# Patient Record
Sex: Female | Born: 1955 | Race: White | Hispanic: No | Marital: Married | Smoking: Never smoker
Health system: Southern US, Community
[De-identification: ages and names within clinical notes are randomized; demographics above are authoritative.]

## PROBLEM LIST (undated history)

## (undated) DIAGNOSIS — E785 Hyperlipidemia, unspecified: Secondary | ICD-10-CM

## (undated) DIAGNOSIS — R079 Chest pain, unspecified: Secondary | ICD-10-CM

## (undated) DIAGNOSIS — H269 Unspecified cataract: Secondary | ICD-10-CM

## (undated) DIAGNOSIS — Z8739 Personal history of other diseases of the musculoskeletal system and connective tissue: Secondary | ICD-10-CM

## (undated) DIAGNOSIS — J301 Allergic rhinitis due to pollen: Secondary | ICD-10-CM

## (undated) DIAGNOSIS — M5441 Lumbago with sciatica, right side: Secondary | ICD-10-CM

## (undated) DIAGNOSIS — M1990 Unspecified osteoarthritis, unspecified site: Secondary | ICD-10-CM

## (undated) DIAGNOSIS — H9319 Tinnitus, unspecified ear: Secondary | ICD-10-CM

## (undated) DIAGNOSIS — M549 Dorsalgia, unspecified: Secondary | ICD-10-CM

## (undated) DIAGNOSIS — M722 Plantar fascial fibromatosis: Secondary | ICD-10-CM

## (undated) DIAGNOSIS — R0602 Shortness of breath: Secondary | ICD-10-CM

## (undated) DIAGNOSIS — I10 Essential (primary) hypertension: Secondary | ICD-10-CM

## (undated) DIAGNOSIS — R600 Localized edema: Secondary | ICD-10-CM

## (undated) DIAGNOSIS — M2550 Pain in unspecified joint: Secondary | ICD-10-CM

## (undated) DIAGNOSIS — IMO0002 Reserved for concepts with insufficient information to code with codable children: Secondary | ICD-10-CM

## (undated) DIAGNOSIS — I4510 Unspecified right bundle-branch block: Secondary | ICD-10-CM

## (undated) DIAGNOSIS — R12 Heartburn: Secondary | ICD-10-CM

## (undated) DIAGNOSIS — K5900 Constipation, unspecified: Secondary | ICD-10-CM

## (undated) DIAGNOSIS — T7840XA Allergy, unspecified, initial encounter: Secondary | ICD-10-CM

## (undated) HISTORY — DX: Hyperlipidemia, unspecified: E78.5

## (undated) HISTORY — DX: Chest pain, unspecified: R07.9

## (undated) HISTORY — DX: Shortness of breath: R06.02

## (undated) HISTORY — DX: Plantar fascial fibromatosis: M72.2

## (undated) HISTORY — DX: Reserved for concepts with insufficient information to code with codable children: IMO0002

## (undated) HISTORY — DX: Pain in unspecified joint: M25.50

## (undated) HISTORY — DX: Tinnitus, unspecified ear: H93.19

## (undated) HISTORY — DX: Lumbago with sciatica, right side: M54.41

## (undated) HISTORY — DX: Unspecified right bundle-branch block: I45.10

## (undated) HISTORY — DX: Allergic rhinitis due to pollen: J30.1

## (undated) HISTORY — DX: Unspecified osteoarthritis, unspecified site: M19.90

## (undated) HISTORY — DX: Dorsalgia, unspecified: M54.9

## (undated) HISTORY — DX: Allergy, unspecified, initial encounter: T78.40XA

## (undated) HISTORY — DX: Essential (primary) hypertension: I10

## (undated) HISTORY — DX: Localized edema: R60.0

## (undated) HISTORY — DX: Unspecified cataract: H26.9

## (undated) HISTORY — DX: Constipation, unspecified: K59.00

## (undated) HISTORY — DX: Heartburn: R12

## (undated) HISTORY — DX: Personal history of other diseases of the musculoskeletal system and connective tissue: Z87.39

---
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
## 1986-04-04 HISTORY — PX: CYSTECTOMY: SUR359

## 1998-04-27 ENCOUNTER — Other Ambulatory Visit: Admission: RE | Admit: 1998-04-27 | Discharge: 1998-04-27 | Payer: Self-pay | Admitting: Obstetrics and Gynecology

## 1999-10-22 ENCOUNTER — Ambulatory Visit (HOSPITAL_COMMUNITY): Admission: RE | Admit: 1999-10-22 | Discharge: 1999-10-22 | Payer: Self-pay | Admitting: Obstetrics and Gynecology

## 1999-10-22 ENCOUNTER — Encounter: Payer: Self-pay | Admitting: Obstetrics and Gynecology

## 2002-07-19 ENCOUNTER — Encounter: Payer: Self-pay | Admitting: Sports Medicine

## 2002-07-19 ENCOUNTER — Encounter: Admission: RE | Admit: 2002-07-19 | Discharge: 2002-07-19 | Payer: Self-pay | Admitting: Sports Medicine

## 2003-10-30 ENCOUNTER — Other Ambulatory Visit: Admission: RE | Admit: 2003-10-30 | Discharge: 2003-10-30 | Payer: Self-pay | Admitting: Obstetrics and Gynecology

## 2004-09-06 ENCOUNTER — Encounter: Admission: RE | Admit: 2004-09-06 | Discharge: 2004-09-06 | Payer: Self-pay | Admitting: Orthopedic Surgery

## 2005-10-17 ENCOUNTER — Ambulatory Visit (HOSPITAL_COMMUNITY): Admission: RE | Admit: 2005-10-17 | Discharge: 2005-10-17 | Payer: Self-pay | Admitting: Obstetrics and Gynecology

## 2010-04-30 ENCOUNTER — Other Ambulatory Visit (HOSPITAL_COMMUNITY): Payer: Self-pay | Admitting: Family Medicine

## 2010-04-30 DIAGNOSIS — Z0000 Encounter for general adult medical examination without abnormal findings: Secondary | ICD-10-CM

## 2010-05-06 ENCOUNTER — Ambulatory Visit (HOSPITAL_COMMUNITY)
Admission: RE | Admit: 2010-05-06 | Discharge: 2010-05-06 | Disposition: A | Discharge status: Home / Self Care | Payer: BC Managed Care – PPO | Source: Ambulatory Visit | Attending: Family Medicine | Admitting: Family Medicine

## 2010-05-06 DIAGNOSIS — Z1231 Encounter for screening mammogram for malignant neoplasm of breast: Secondary | ICD-10-CM | POA: Insufficient documentation

## 2010-05-06 DIAGNOSIS — Z0000 Encounter for general adult medical examination without abnormal findings: Secondary | ICD-10-CM

## 2010-10-04 LAB — HM COLONOSCOPY: HM Colonoscopy: NORMAL

## 2011-10-03 LAB — HM PAP SMEAR: HM Pap smear: NORMAL

## 2011-10-14 ENCOUNTER — Other Ambulatory Visit (HOSPITAL_COMMUNITY): Payer: Self-pay | Admitting: Nurse Practitioner

## 2011-10-14 DIAGNOSIS — Z1231 Encounter for screening mammogram for malignant neoplasm of breast: Secondary | ICD-10-CM

## 2011-11-02 ENCOUNTER — Ambulatory Visit (HOSPITAL_COMMUNITY)
Admission: RE | Admit: 2011-11-02 | Discharge: 2011-11-02 | Disposition: A | Discharge status: Home / Self Care | Payer: BC Managed Care – PPO | Source: Ambulatory Visit | Attending: Nurse Practitioner | Admitting: Nurse Practitioner

## 2011-11-02 DIAGNOSIS — Z1231 Encounter for screening mammogram for malignant neoplasm of breast: Secondary | ICD-10-CM | POA: Insufficient documentation

## 2012-10-01 ENCOUNTER — Other Ambulatory Visit (HOSPITAL_COMMUNITY): Payer: Self-pay | Admitting: Nurse Practitioner

## 2012-10-01 DIAGNOSIS — Z1231 Encounter for screening mammogram for malignant neoplasm of breast: Secondary | ICD-10-CM

## 2012-11-02 ENCOUNTER — Ambulatory Visit (HOSPITAL_COMMUNITY)
Admission: RE | Admit: 2012-11-02 | Discharge: 2012-11-02 | Disposition: A | Discharge status: Home / Self Care | Payer: BC Managed Care – PPO | Source: Ambulatory Visit | Attending: Nurse Practitioner | Admitting: Nurse Practitioner

## 2012-11-02 DIAGNOSIS — Z1231 Encounter for screening mammogram for malignant neoplasm of breast: Secondary | ICD-10-CM

## 2013-10-03 ENCOUNTER — Telehealth: Payer: Self-pay

## 2013-10-03 NOTE — Telephone Encounter (Signed)
New Patient Previous PCP:  [facility]   Medication and allergies:  Reviewed and updated  90 day supply/mail order: n/a Local pharmacy:  [facility] [phone] - [place], [place] - [address] [facility] [address]   Immunizations due:  Td/tdap   A/P: No changes to personal, family history or past surgical hx PAP- 10/2011- normal--[facility]- 2012- normal---Dr. Nolon Bussingider in [place] [facility]- 11/02/12- negative Tdap- unsure of last vaccine   To Discuss with Provider: Nothing at this time.

## 2013-10-07 ENCOUNTER — Encounter: Payer: Self-pay | Admitting: Family Medicine

## 2013-10-07 ENCOUNTER — Ambulatory Visit (INDEPENDENT_AMBULATORY_CARE_PROVIDER_SITE_OTHER): Payer: BC Managed Care – PPO | Admitting: Family Medicine

## 2013-10-07 VITALS — BP 146/96 | HR 74 | Temp 98.5°F | Ht 63.5 in | Wt 287.2 lb

## 2013-10-07 DIAGNOSIS — R030 Elevated blood-pressure reading, without diagnosis of hypertension: Secondary | ICD-10-CM

## 2013-10-07 DIAGNOSIS — Z0000 Encounter for general adult medical examination without abnormal findings: Secondary | ICD-10-CM

## 2013-10-07 DIAGNOSIS — IMO0001 Reserved for inherently not codable concepts without codable children: Secondary | ICD-10-CM | POA: Insufficient documentation

## 2013-10-07 HISTORY — DX: Encounter for general adult medical examination without abnormal findings: Z00.00

## 2013-10-07 HISTORY — DX: Morbid (severe) obesity due to excess calories: E66.01

## 2013-10-07 NOTE — Assessment & Plan Note (Signed)
New.  Pt's BMI>50.  Discussed need for healthy, low carb diet and regular exercise.  Will check labs to risk stratify.  Will follow closely.

## 2013-10-07 NOTE — Assessment & Plan Note (Signed)
Pt's PE WNL w/ exception of obesity.  UTD on mammo, pap, colonoscopy.  Check labs.  Anticipatory guidance provided.  

## 2013-10-07 NOTE — Patient Instructions (Signed)
Follow up in 3 months to recheck BP and assess weight loss progress We'll notify you of your lab results and make any changes if needed Try and make healthy, low carb food choices and goal is for 30 min of activity 3-4x/week (if not more) Call with any questions or concerns Welcome!  We're glad to have you!!

## 2013-10-07 NOTE — Progress Notes (Signed)
   Subjective:    Patient ID: Mary Montgomery, female    DOB: 10/12/1955, 58 y.o.   MRN: [id number]  HPI New to establish.  Previous MD- [facility]  UTD on mammo (August), Colonoscopy, pap (due next year)  Elevated BP- pt has cuff at home, admits to being nervous for today's appt.   Review of Systems Patient reports no vision/ hearing changes, adenopathy,fever, weight change,  persistant/recurrent hoarseness , swallowing issues, chest pain, palpitations, edema, persistant/recurrent cough, hemoptysis, dyspnea (rest/exertional/paroxysmal nocturnal), gastrointestinal bleeding (melena, rectal bleeding), abdominal pain, significant heartburn, bowel changes, GU symptoms (dysuria, hematuria, incontinence), Gyn symptoms (abnormal  bleeding, pain),  syncope, focal weakness, memory loss, numbness & tingling, skin/hair/nail changes, abnormal bruising or bleeding, anxiety, or depression.     Objective:   Physical Exam General Appearance:    Alert, cooperative, no distress, appears stated age, obese  Head:    Normocephalic, without obvious abnormality, atraumatic  Eyes:    PERRL, conjunctiva/corneas clear, EOM's intact, fundi    benign, both eyes  Ears:    Normal TM's and external ear canals, both ears  Nose:   Nares normal, septum midline, mucosa normal, no drainage    or sinus tenderness  Throat:   Lips, mucosa, and tongue normal; teeth and gums normal  Neck:   Supple, symmetrical, trachea midline, no adenopathy;    Thyroid: no enlargement/tenderness/nodules  Back:     Symmetric, no curvature, ROM normal, no CVA tenderness  Lungs:     Clear to auscultation bilaterally, respirations unlabored  Chest Wall:    No tenderness or deformity   Heart:    Regular rate and rhythm, S1 and S2 normal, no murmur, rub   or gallop  Breast Exam:    Deferred to mammo  Abdomen:     Soft, non-tender, bowel sounds active all four quadrants,    no masses, no organomegaly  Genitalia:    Deferred to next  year  Rectal:    Extremities:   Extremities normal, atraumatic, no cyanosis or edema  Pulses:   2+ and symmetric all extremities  Skin:   Skin color, texture, turgor normal, no rashes or lesions  Lymph nodes:   Cervical, supraclavicular, and axillary nodes normal  Neurologic:   CNII-XII intact, normal strength, sensation and reflexes    throughout          Assessment & Plan:

## 2013-10-07 NOTE — Assessment & Plan Note (Signed)
New to provider.  Pt reports she was nervous for appt today.  Has home cuff- willing to check and report if readings consistently >145/90.  Reviewed importance of low sodium diet, regular exercise, and weight loss.  Pt expressed understanding and is in agreement w/ plan.

## 2013-10-07 NOTE — Progress Notes (Signed)
Pre visit review using our clinic review tool, if applicable. No additional management support is needed unless otherwise documented below in the visit note. 

## 2013-10-08 LAB — CBC WITH DIFFERENTIAL/PLATELET
BASOS ABS: 0.1 10*3/uL (ref 0.0–0.1)
BASOS PCT: 0.7 % (ref 0.0–3.0)
EOS ABS: 0.1 10*3/uL (ref 0.0–0.7)
Eosinophils Relative: 1.1 % (ref 0.0–5.0)
HEMATOCRIT: 44.1 % (ref 36.0–46.0)
HEMOGLOBIN: 14.7 g/dL (ref 12.0–15.0)
LYMPHS ABS: 1.5 10*3/uL (ref 0.7–4.0)
LYMPHS PCT: 20.9 % (ref 12.0–46.0)
MCHC: 33.2 g/dL (ref 30.0–36.0)
MCV: 90.7 fl (ref 78.0–100.0)
Monocytes Absolute: 0.3 10*3/uL (ref 0.1–1.0)
Monocytes Relative: 4.3 % (ref 3.0–12.0)
NEUTROS ABS: 5.1 10*3/uL (ref 1.4–7.7)
Neutrophils Relative %: 73 % (ref 43.0–77.0)
Platelets: 222 10*3/uL (ref 150.0–400.0)
RBC: 4.87 Mil/uL (ref 3.87–5.11)
RDW: 14.3 % (ref 11.5–15.5)
WBC: 7 10*3/uL (ref 4.0–10.5)

## 2013-10-08 LAB — HEPATIC FUNCTION PANEL
ALK PHOS: 47 U/L (ref 39–117)
ALT: 23 U/L (ref 0–35)
AST: 20 U/L (ref 0–37)
Albumin: 3.9 g/dL (ref 3.5–5.2)
BILIRUBIN DIRECT: 0.1 mg/dL (ref 0.0–0.3)
BILIRUBIN TOTAL: 0.5 mg/dL (ref 0.2–1.2)
TOTAL PROTEIN: 6.8 g/dL (ref 6.0–8.3)

## 2013-10-08 LAB — BASIC METABOLIC PANEL
BUN: 17 mg/dL (ref 6–23)
CALCIUM: 9.1 mg/dL (ref 8.4–10.5)
CO2: 31 mEq/L (ref 19–32)
Chloride: 101 mEq/L (ref 96–112)
Creatinine, Ser: 0.6 mg/dL (ref 0.4–1.2)
GFR: 116.57 mL/min (ref >60.00)
GLUCOSE: 79 mg/dL (ref 70–99)
POTASSIUM: 4.1 meq/L (ref 3.5–5.1)
SODIUM: 139 meq/L (ref 135–145)

## 2013-10-08 LAB — LIPID PANEL
CHOL/HDL RATIO: 4
CHOLESTEROL: 265 mg/dL — AB (ref 0–200)
HDL: 72.4 mg/dL (ref >39.00)
LDL CALC: 162 mg/dL — AB (ref 0–99)
NONHDL: 192.6
Triglycerides: 151 mg/dL — ABNORMAL HIGH (ref 0.0–149.0)
VLDL: 30.2 mg/dL (ref 0.0–40.0)

## 2013-10-08 LAB — TSH: TSH: 0.42 u[IU]/mL (ref 0.35–4.50)

## 2013-10-08 LAB — VITAMIN D 25 HYDROXY (VIT D DEFICIENCY, FRACTURES): VITD: 34.33 ng/mL

## 2013-10-08 LAB — HEMOGLOBIN A1C: Hgb A1c MFr Bld: 5.6 % (ref 4.6–6.5)

## 2013-10-09 ENCOUNTER — Other Ambulatory Visit: Payer: Self-pay | Admitting: General Practice

## 2013-10-09 DIAGNOSIS — E785 Hyperlipidemia, unspecified: Secondary | ICD-10-CM

## 2013-10-09 MED ORDER — ATORVASTATIN CALCIUM 20 MG PO TABS: 20.0000 mg | ORAL_TABLET | Freq: Every day | ORAL | Status: DC

## 2013-10-28 ENCOUNTER — Other Ambulatory Visit: Payer: Self-pay | Admitting: Family Medicine

## 2013-10-28 DIAGNOSIS — Z1231 Encounter for screening mammogram for malignant neoplasm of breast: Secondary | ICD-10-CM

## 2013-11-28 ENCOUNTER — Ambulatory Visit (HOSPITAL_COMMUNITY): Payer: BC Managed Care – PPO

## 2013-12-05 ENCOUNTER — Ambulatory Visit (HOSPITAL_COMMUNITY): Payer: BC Managed Care – PPO

## 2013-12-17 ENCOUNTER — Ambulatory Visit (HOSPITAL_COMMUNITY)
Admission: RE | Admit: 2013-12-17 | Discharge: 2013-12-17 | Disposition: A | Discharge status: Home / Self Care | Payer: BC Managed Care – PPO | Source: Ambulatory Visit | Attending: Family Medicine | Admitting: Family Medicine

## 2013-12-17 DIAGNOSIS — Z1231 Encounter for screening mammogram for malignant neoplasm of breast: Secondary | ICD-10-CM | POA: Diagnosis not present

## 2014-01-08 ENCOUNTER — Ambulatory Visit: Payer: BC Managed Care – PPO | Admitting: Family Medicine

## 2014-01-09 ENCOUNTER — Ambulatory Visit (INDEPENDENT_AMBULATORY_CARE_PROVIDER_SITE_OTHER): Payer: BC Managed Care – PPO | Admitting: Family Medicine

## 2014-01-09 ENCOUNTER — Encounter: Payer: Self-pay | Admitting: Family Medicine

## 2014-01-09 DIAGNOSIS — Z23 Encounter for immunization: Secondary | ICD-10-CM

## 2014-01-09 DIAGNOSIS — E785 Hyperlipidemia, unspecified: Secondary | ICD-10-CM

## 2014-01-09 HISTORY — DX: Hyperlipidemia, unspecified: E78.5

## 2014-01-09 NOTE — Assessment & Plan Note (Signed)
Noted on labs done at last visit.  Pt did not start Lipitor as recommended.  Started herbal OTC product.  Pt reports switching to low fat diet.  Repeat labs and start meds prn.  Pt expressed understanding and is in agreement w/ plan.

## 2014-01-09 NOTE — Patient Instructions (Signed)
Schedule a fasting lab appt at your convenience We'll notify you of your lab results and make any changes if needed Keep up the good work on the healthy diet- it's working! Continue to get regular exercise as you are able Call with any questions or concerns ENJOY THE [place]!!!!

## 2014-01-09 NOTE — Progress Notes (Signed)
   Subjective:    Patient ID: Mary Montgomery, female    DOB: Jul 11, 1955, 58 y.o.   MRN: [id number]  HPI Obesity- ongoing issue for pt.  Pt has lost 4 lbs.  Pt reports she has changed back to low fat diet.  Pt reports this 'feels better' than low carb.  Pt reports increased exercise compared to previous after having knee procedures this summer.  Hyperlipidemia- noted at last visit, LDL 162 and total cholesterol 265.  Pt did not start Lipitor as recommended.  Trying OTC herbal supplement- Pau d' arco.  Pt not fasting today.  No CP, SOB, HAs, visual changes, abd pain, edema.   Review of Systems For ROS see HPI     Objective:   Physical Exam  Vitals reviewed. Constitutional: She is oriented to person, place, and time. She appears well-developed and well-nourished. No distress.  obese  HENT:  Head: Normocephalic and atraumatic.  Eyes: Conjunctivae and EOM are normal. Pupils are equal, round, and reactive to light.  Neck: Normal range of motion. Neck supple. No thyromegaly present.  Cardiovascular: Normal rate, regular rhythm, normal heart sounds and intact distal pulses.   No murmur heard. Pulmonary/Chest: Effort normal and breath sounds normal. No respiratory distress.  Abdominal: Soft. She exhibits no distension. There is no tenderness.  Musculoskeletal: She exhibits no edema.  Lymphadenopathy:    She has no cervical adenopathy.  Neurological: She is alert and oriented to person, place, and time.  Skin: Skin is warm and dry.  Psychiatric: She has a normal mood and affect. Her behavior is normal.          Assessment & Plan:

## 2014-01-09 NOTE — Progress Notes (Signed)
Pre visit review using our clinic review tool, if applicable. No additional management support is needed unless otherwise documented below in the visit note. 

## 2014-01-09 NOTE — Assessment & Plan Note (Signed)
Pt has lost 4 lbs since last visit.  Applauded her efforts.  Stressed ongoing need for healthy diet and addition of regular exercise.  Will follow.

## 2014-01-15 ENCOUNTER — Other Ambulatory Visit (INDEPENDENT_AMBULATORY_CARE_PROVIDER_SITE_OTHER): Payer: BC Managed Care – PPO

## 2014-01-15 DIAGNOSIS — E785 Hyperlipidemia, unspecified: Secondary | ICD-10-CM

## 2014-01-15 LAB — LIPID PANEL
CHOL/HDL RATIO: 4
CHOLESTEROL: 240 mg/dL — AB (ref 0–200)
HDL: 60.8 mg/dL (ref >39.00)
LDL CALC: 161 mg/dL — AB (ref 0–99)
NonHDL: 179.2
TRIGLYCERIDES: 93 mg/dL (ref 0.0–149.0)
VLDL: 18.6 mg/dL (ref 0.0–40.0)

## 2014-01-15 LAB — HEPATIC FUNCTION PANEL
ALT: 24 U/L (ref 0–35)
AST: 19 U/L (ref 0–37)
Albumin: 3.5 g/dL (ref 3.5–5.2)
Alkaline Phosphatase: 54 U/L (ref 39–117)
BILIRUBIN TOTAL: 0.6 mg/dL (ref 0.2–1.2)
Bilirubin, Direct: 0 mg/dL (ref 0.0–0.3)
TOTAL PROTEIN: 7.2 g/dL (ref 6.0–8.3)

## 2014-01-21 ENCOUNTER — Other Ambulatory Visit: Payer: BC Managed Care – PPO

## 2015-03-25 ENCOUNTER — Telehealth: Payer: Self-pay

## 2015-03-25 NOTE — Telephone Encounter (Signed)
Pre Visit call completed. 

## 2015-03-26 ENCOUNTER — Encounter: Payer: Self-pay | Admitting: Family Medicine

## 2015-03-26 ENCOUNTER — Encounter: Payer: BC Managed Care – PPO | Admitting: Family Medicine

## 2015-03-26 ENCOUNTER — Ambulatory Visit (INDEPENDENT_AMBULATORY_CARE_PROVIDER_SITE_OTHER): Payer: BC Managed Care – PPO | Admitting: Family Medicine

## 2015-03-26 ENCOUNTER — Other Ambulatory Visit (HOSPITAL_COMMUNITY)
Admission: RE | Admit: 2015-03-26 | Discharge: 2015-03-26 | Disposition: A | Discharge status: Home / Self Care | Payer: BC Managed Care – PPO | Source: Ambulatory Visit | Attending: Family Medicine | Admitting: Family Medicine

## 2015-03-26 VITALS — BP 138/90 | HR 60 | Temp 97.4°F | Ht 63.0 in | Wt 281.1 lb

## 2015-03-26 DIAGNOSIS — Z124 Encounter for screening for malignant neoplasm of cervix: Secondary | ICD-10-CM

## 2015-03-26 DIAGNOSIS — Z0000 Encounter for general adult medical examination without abnormal findings: Secondary | ICD-10-CM | POA: Diagnosis not present

## 2015-03-26 DIAGNOSIS — Z01419 Encounter for gynecological examination (general) (routine) without abnormal findings: Secondary | ICD-10-CM | POA: Diagnosis present

## 2015-03-26 DIAGNOSIS — Z1151 Encounter for screening for human papillomavirus (HPV): Secondary | ICD-10-CM | POA: Insufficient documentation

## 2015-03-26 DIAGNOSIS — Z1231 Encounter for screening mammogram for malignant neoplasm of breast: Secondary | ICD-10-CM

## 2015-03-26 LAB — CBC WITH DIFFERENTIAL/PLATELET
BASOS PCT: 0.4 % (ref 0.0–3.0)
Basophils Absolute: 0 10*3/uL (ref 0.0–0.1)
EOS ABS: 0.1 10*3/uL (ref 0.0–0.7)
Eosinophils Relative: 1.2 % (ref 0.0–5.0)
HEMATOCRIT: 43 % (ref 36.0–46.0)
Hemoglobin: 14.1 g/dL (ref 12.0–15.0)
LYMPHS ABS: 2 10*3/uL (ref 0.7–4.0)
LYMPHS PCT: 29.7 % (ref 12.0–46.0)
MCHC: 32.7 g/dL (ref 30.0–36.0)
MCV: 90.4 fl (ref 78.0–100.0)
Monocytes Absolute: 0.4 10*3/uL (ref 0.1–1.0)
Monocytes Relative: 5.8 % (ref 3.0–12.0)
NEUTROS ABS: 4.3 10*3/uL (ref 1.4–7.7)
Neutrophils Relative %: 62.9 % (ref 43.0–77.0)
PLATELETS: 192 10*3/uL (ref 150.0–400.0)
RBC: 4.75 Mil/uL (ref 3.87–5.11)
RDW: 13.5 % (ref 11.5–15.5)
WBC: 6.8 10*3/uL (ref 4.0–10.5)

## 2015-03-26 LAB — LIPID PANEL
CHOLESTEROL: 208 mg/dL — AB (ref 0–200)
HDL: 66.1 mg/dL (ref >39.00)
LDL CALC: 126 mg/dL — AB (ref 0–99)
NonHDL: 141.81
TRIGLYCERIDES: 81 mg/dL (ref 0.0–149.0)
Total CHOL/HDL Ratio: 3
VLDL: 16.2 mg/dL (ref 0.0–40.0)

## 2015-03-26 LAB — HEPATIC FUNCTION PANEL
ALK PHOS: 57 U/L (ref 39–117)
ALT: 25 U/L (ref 0–35)
AST: 20 U/L (ref 0–37)
Albumin: 4.1 g/dL (ref 3.5–5.2)
Bilirubin, Direct: 0.1 mg/dL (ref 0.0–0.3)
Total Bilirubin: 0.5 mg/dL (ref 0.2–1.2)
Total Protein: 6.9 g/dL (ref 6.0–8.3)

## 2015-03-26 LAB — BASIC METABOLIC PANEL
BUN: 14 mg/dL (ref 6–23)
CHLORIDE: 102 meq/L (ref 96–112)
CO2: 33 mEq/L — ABNORMAL HIGH (ref 19–32)
Calcium: 9.2 mg/dL (ref 8.4–10.5)
Creatinine, Ser: 0.51 mg/dL (ref 0.40–1.20)
GFR: 131.84 mL/min (ref >60.00)
GLUCOSE: 79 mg/dL (ref 70–99)
POTASSIUM: 3.7 meq/L (ref 3.5–5.1)
Sodium: 140 mEq/L (ref 135–145)

## 2015-03-26 LAB — HEMOGLOBIN A1C: Hgb A1c MFr Bld: 5.7 % (ref 4.6–6.5)

## 2015-03-26 LAB — TSH: TSH: 1.46 u[IU]/mL (ref 0.35–4.50)

## 2015-03-26 LAB — VITAMIN D 25 HYDROXY (VIT D DEFICIENCY, FRACTURES): VITD: 28.7 ng/mL — AB (ref 30.00–100.00)

## 2015-03-26 NOTE — Assessment & Plan Note (Signed)
Ongoing issue.  Pt has lost a few lbs since last visit w/ changes to diet.  Encouraged regular exercise.  Check labs to risk stratify.  Will continue to follow.

## 2015-03-26 NOTE — Progress Notes (Signed)
   Subjective:    Patient ID: Mary BostonSharon L Geise, female    DOB: [birth year], 57 y.o.   MRN: [id number]  HPI CPE- UTD on mammo, due for mammo.  Due for pap.   Review of Systems Patient reports no vision/ hearing changes, adenopathy,fever, weight change,  persistant/recurrent hoarseness , swallowing issues, chest pain, palpitations, edema, persistant/recurrent cough, hemoptysis, dyspnea (rest/exertional/paroxysmal nocturnal), gastrointestinal bleeding (melena, rectal bleeding), abdominal pain, significant heartburn, bowel changes, GU symptoms (dysuria, hematuria, incontinence), Gyn symptoms (abnormal  bleeding, pain),  syncope, focal weakness, memory loss, numbness & tingling, skin/hair/nail changes, abnormal bruising or bleeding, anxiety, or depression.     Objective:   Physical Exam  General Appearance:    Alert, cooperative, no distress, appears stated age, morbidly obese  Head:    Normocephalic, without obvious abnormality, atraumatic  Eyes:    PERRL, conjunctiva/corneas clear, EOM's intact, fundi    benign, both eyes  Ears:    Normal TM's and external ear canals, both ears  Nose:   Nares normal, septum midline, mucosa normal, no drainage    or sinus tenderness  Throat:   Lips, mucosa, and tongue normal; teeth and gums normal  Neck:   Supple, symmetrical, trachea midline, no adenopathy;    Thyroid: no enlargement/tenderness/nodules  Back:     Symmetric, no curvature, ROM normal, no CVA tenderness  Lungs:     Clear to auscultation bilaterally, respirations unlabored  Chest Wall:    No tenderness or deformity   Heart:    Regular rate and rhythm, S1 and S2 normal, no murmur, rub   or gallop  Breast Exam:    No tenderness, masses, or nipple abnormality  Abdomen:     Soft, non-tender, bowel sounds active all four quadrants,    no masses, no organomegaly  Genitalia:    External genitalia normal, cervix normal in appearance, no CMT, uterus in normal size and position, adnexa w/out mass or  tenderness, mucosa pink and moist, no lesions or discharge present  Rectal:    Normal external appearance  Extremities:   Extremities normal, atraumatic, no cyanosis or edema  Pulses:   2+ and symmetric all extremities  Skin:   Skin color, texture, turgor normal, no rashes or lesions  Lymph nodes:   Cervical, supraclavicular, and axillary nodes normal  Neurologic:   CNII-XII intact, normal strength, sensation and reflexes    throughout          Assessment & Plan:

## 2015-03-26 NOTE — Assessment & Plan Note (Signed)
Pt's PE WNL w/ exception of morbid obesity.  Due for mammo- order entered.  Pap collected today.  UTD on colonoscopy.  Check labs.  Anticipatory guidance provided.

## 2015-03-26 NOTE — Progress Notes (Signed)
Pre visit review using our clinic review tool, if applicable. No additional management support is needed unless otherwise documented below in the visit note. 

## 2015-03-26 NOTE — Patient Instructions (Signed)
Schedule a procedure visit for your skin tags (30 minutes at your convenience) We'll notify you of your lab results and make any changes if needed Stop downstairs and schedule your mammogram Continue to work on healthy diet and regular exercise- you are doing it! Call with any questions or concerns If you want to join us at the new [place] office, any scheduled appointments will automatically transfer and we will see you at [address], [place], [place] [id number] (OPENING 04/07/15) Happy Holidays!!!

## 2015-03-27 LAB — CYTOLOGY - PAP

## 2015-03-31 ENCOUNTER — Ambulatory Visit (HOSPITAL_BASED_OUTPATIENT_CLINIC_OR_DEPARTMENT_OTHER)
Admission: RE | Admit: 2015-03-31 | Discharge: 2015-03-31 | Disposition: A | Discharge status: Home / Self Care | Payer: BC Managed Care – PPO | Source: Ambulatory Visit | Attending: Family Medicine | Admitting: Family Medicine

## 2015-03-31 DIAGNOSIS — Z1231 Encounter for screening mammogram for malignant neoplasm of breast: Secondary | ICD-10-CM | POA: Insufficient documentation

## 2015-04-17 ENCOUNTER — Ambulatory Visit (INDEPENDENT_AMBULATORY_CARE_PROVIDER_SITE_OTHER): Payer: BC Managed Care – PPO | Admitting: Family Medicine

## 2015-04-17 ENCOUNTER — Encounter: Payer: Self-pay | Admitting: Family Medicine

## 2015-04-17 VITALS — BP 128/86 | HR 79 | Temp 98.1°F | Ht 63.0 in | Wt 285.4 lb

## 2015-04-17 DIAGNOSIS — Q828 Other specified congenital malformations of skin: Secondary | ICD-10-CM | POA: Diagnosis not present

## 2015-04-17 NOTE — Patient Instructions (Signed)
Follow up as needed Keep areas clean and dry The silver nitrate will look charred- that's normal If you develop pus or spreading redness-unlikely but possible- let me know! Happy New Year!!!

## 2015-04-17 NOTE — Progress Notes (Signed)
Pre visit review using our clinic review tool, if applicable. No additional management support is needed unless otherwise documented below in the visit note. 

## 2015-04-19 NOTE — Progress Notes (Signed)
   Subjective:    Patient ID: Duane BostonSharon L Spielmann, female    DOB: June 19, 1955, 60 y.o.   MRN: [id number]  HPI Pt here today for skin tag removal.  Pt gave consent for 4 skin tags under L arm and 1 under R arm to be removed.     Review of Systems For ROS see HPI     Objective:   Physical Exam Area cleaned w/ alcohol and betadine.  1% lidocaine injected into the base of each skin tag.  Using gentle traction, each skin tag was snipped using sharp sterile scissors.  Pressure was applied w/ gauze and then each area was cauterized using silver nitrate sticks.  Minimal bleeding noted.  Abx ointment was applied to each and topped w/ bandaids.  Pt tolerated procedure w/o difficulty and was pleased w/ results.       Assessment & Plan:  Skin tags- 5 removed using above technique.  No noted complications.  Reviewed wound care w/ pt.  Pt expressed understanding and is in agreement w/ plan.

## 2016-06-14 ENCOUNTER — Other Ambulatory Visit: Payer: Self-pay | Admitting: Family Medicine

## 2016-06-14 DIAGNOSIS — Z1231 Encounter for screening mammogram for malignant neoplasm of breast: Secondary | ICD-10-CM

## 2016-07-04 ENCOUNTER — Ambulatory Visit (HOSPITAL_BASED_OUTPATIENT_CLINIC_OR_DEPARTMENT_OTHER)
Admission: RE | Admit: 2016-07-04 | Discharge: 2016-07-04 | Disposition: A | Discharge status: Home / Self Care | Payer: BC Managed Care – PPO | Source: Ambulatory Visit | Attending: Family Medicine | Admitting: Family Medicine

## 2016-07-04 DIAGNOSIS — N6489 Other specified disorders of breast: Secondary | ICD-10-CM | POA: Insufficient documentation

## 2016-07-04 DIAGNOSIS — Z1231 Encounter for screening mammogram for malignant neoplasm of breast: Secondary | ICD-10-CM

## 2016-09-28 ENCOUNTER — Ambulatory Visit (INDEPENDENT_AMBULATORY_CARE_PROVIDER_SITE_OTHER): Payer: BC Managed Care – PPO | Admitting: Physician Assistant

## 2016-09-28 ENCOUNTER — Encounter: Payer: Self-pay | Admitting: Physician Assistant

## 2016-09-28 VITALS — BP 130/82 | HR 69 | Temp 99.2°F | Resp 14 | Ht 63.0 in | Wt 288.0 lb

## 2016-09-28 DIAGNOSIS — H6121 Impacted cerumen, right ear: Secondary | ICD-10-CM

## 2016-09-28 NOTE — Patient Instructions (Addendum)
Please avoid putting qtips in the ear. The impaction has been removed. Follow-up if there is any recurrence of symptoms.   Earwax Buildup, Adult The ears produce a substance called earwax that helps keep bacteria out of the ear and protects the skin in the ear canal. Occasionally, earwax can build up in the ear and cause discomfort or hearing loss. What increases the risk? This condition is more likely to develop in people who:  Are female.  Are elderly.  Naturally produce more earwax.  Clean their ears often with cotton swabs.  Use earplugs often.  Use in-ear headphones often.  Wear hearing aids.  Have narrow ear canals.  Have earwax that is overly thick or sticky.  Have eczema.  Are dehydrated.  Have excess hair in the ear canal.  What are the signs or symptoms? Symptoms of this condition include:  Reduced or muffled hearing.  A feeling of fullness in the ear or feeling that the ear is plugged.  Fluid coming from the ear.  Ear pain.  Ear itch.  Ringing in the ear.  Coughing.  An obvious piece of earwax that can be seen inside the ear canal.  How is this diagnosed? This condition may be diagnosed based on:  Your symptoms.  Your medical history.  An ear exam. During the exam, your health care provider will look into your ear with an instrument called an otoscope.  You may have tests, including a hearing test. How is this treated? This condition may be treated by:  Using ear drops to soften the earwax.  Having the earwax removed by a health care provider. The health care provider may: ? Flush the ear with water. ? Use an instrument that has a loop on the end (curette). ? Use a suction device.  Surgery to remove the wax buildup. This may be done in severe cases.  Follow these instructions at home:  Take over-the-counter and prescription medicines only as told by your health care provider.  Do not put any objects, including cotton swabs, into  your ear. You can clean the opening of your ear canal with a washcloth or facial tissue.  Follow instructions from your health care provider about cleaning your ears. Do not over-clean your ears.  Drink enough fluid to keep your urine clear or pale yellow. This will help to thin the earwax.  Keep all follow-up visits as told by your health care provider. If earwax builds up in your ears often or if you use hearing aids, consider seeing your health care provider for routine, preventive ear cleanings. Ask your health care provider how often you should schedule your cleanings.  If you have hearing aids, clean them according to instructions from the manufacturer and your health care provider. Contact a health care provider if:  You have ear pain.  You develop a fever.  You have blood, pus, or other fluid coming from your ear.  You have hearing loss.  You have ringing in your ears that does not go away.  Your symptoms do not improve with treatment.  You feel like the room is spinning (vertigo). Summary  Earwax can build up in the ear and cause discomfort or hearing loss.  The most common symptoms of this condition include reduced or muffled hearing and a feeling of fullness in the ear or feeling that the ear is plugged.  This condition may be diagnosed based on your symptoms, your medical history, and an ear exam.  This condition may be  treated by using ear drops to soften the earwax or by having the earwax removed by a health care provider.  Do not put any objects, including cotton swabs, into your ear. You can clean the opening of your ear canal with a washcloth or facial tissue. This information is not intended to replace advice given to you by your health care provider. Make sure you discuss any questions you have with your health care provider. Document Released: 04/28/2004 Document Revised: 06/01/2016 Document Reviewed: 06/01/2016 Elsevier Interactive Patient Education  [employer].

## 2016-09-28 NOTE — Progress Notes (Signed)
   Patient presents to clinic today c/o 3 days of clogged sensation in the R ear. Patient endorses symptoms started after taking a shower. Notes ear fullness. Denies ear pain or drainage from the ear. Denies cold or URI symptoms. Has history of swimmer's ear and ear wax impactions  Past Medical History:  Diagnosis Date  . Arthritis    right knee  . Bulging disc    Lumber 3/4  . Degenerated intervertebral disc    neck  . Hay fever   . Hyperlipemia   . Hypertension   . Plantar fasciitis    bilaterl feet    Current Outpatient Prescriptions on File Prior to Visit  Medication Sig Dispense Refill  . cholecalciferol (VITAMIN D) 1000 UNITS tablet Take 1,000 Units by mouth daily.    . Multiple Vitamin (MULTIVITAMIN) tablet Take 1 tablet by mouth daily.    Marland Kitchen. PAU D ARCO PO Take 4 tablets by mouth daily.    . Red Yeast Rice Extract (RED YEAST RICE PO) Take by mouth.     No current facility-administered medications on file prior to visit.     No Known Allergies  Family History  Problem Relation Age of Onset  . Diabetes Mother   . Hypertension Mother   . Hyperlipidemia Mother   . Dementia Mother   . Parkinsonism Father   . Arthritis Father        and father side of family  . Coarctation of the aorta Brother     Social History   Social History  . Marital status: Married    Spouse name: N/A  . Number of children: N/A  . Years of education: N/A   Social History Main Topics  . Smoking status: Never Smoker  . Smokeless tobacco: Never Used  . Alcohol use No  . Drug use: No  . Sexual activity: Not Asked   Other Topics Concern  . None   Social History Narrative  . None   Review of Systems - See HPI.  All other ROS are negative.  BP 130/82   Pulse 69   Temp 99.2 F (37.3 C) (Oral)   Resp 14   Ht 5\' 3"  (1.6 m)   Wt 288 lb (130.6 kg)   LMP 10/01/2013   SpO2 97%   BMI 51.02 kg/m   Physical Exam  Constitutional: She is oriented to person, place, and time and  well-developed, well-nourished, and in no distress.  HENT:  Head: Normocephalic and atraumatic.  Left Ear: Tympanic membrane normal.  R ear canal with impaction noted.  Eyes: Conjunctivae are normal.  Neck: Neck supple.  Cardiovascular: Normal rate, regular rhythm, normal heart sounds and intact distal pulses.   Pulmonary/Chest: Effort normal and breath sounds normal. No respiratory distress. She has no wheezes. She has no rales. She exhibits no tenderness.  Lymphadenopathy:    She has no cervical adenopathy.  Neurological: She is alert and oriented to person, place, and time.  Skin: Skin is warm and dry. No rash noted.  Psychiatric: Affect normal.  Vitals reviewed.  Assessment/Plan: 1. Hearing loss due to cerumen impaction, right Resolved via ear wax removal from irrigation. Supportive measures discussed. Follow-up with PCP for routine care.   Piedad ClimesMartin, Miyo Aina Cody, PA-C

## 2016-09-28 NOTE — Progress Notes (Signed)
Pre visit review using our clinic review tool, if applicable. No additional management support is needed unless otherwise documented below in the visit note. 

## 2017-08-09 ENCOUNTER — Other Ambulatory Visit: Payer: Self-pay | Admitting: Family Medicine

## 2017-08-09 DIAGNOSIS — Z1231 Encounter for screening mammogram for malignant neoplasm of breast: Secondary | ICD-10-CM

## 2017-08-24 ENCOUNTER — Ambulatory Visit (HOSPITAL_BASED_OUTPATIENT_CLINIC_OR_DEPARTMENT_OTHER)
Admission: RE | Admit: 2017-08-24 | Discharge: 2017-08-24 | Disposition: A | Discharge status: Home / Self Care | Payer: BC Managed Care – PPO | Source: Ambulatory Visit | Attending: Family Medicine | Admitting: Family Medicine

## 2017-08-24 DIAGNOSIS — Z1231 Encounter for screening mammogram for malignant neoplasm of breast: Secondary | ICD-10-CM | POA: Insufficient documentation

## 2018-01-21 ENCOUNTER — Encounter (HOSPITAL_BASED_OUTPATIENT_CLINIC_OR_DEPARTMENT_OTHER): Payer: Self-pay | Admitting: Emergency Medicine

## 2018-01-21 ENCOUNTER — Emergency Department (HOSPITAL_BASED_OUTPATIENT_CLINIC_OR_DEPARTMENT_OTHER): Payer: BC Managed Care – PPO

## 2018-01-21 ENCOUNTER — Other Ambulatory Visit: Payer: Self-pay

## 2018-01-21 ENCOUNTER — Emergency Department (HOSPITAL_BASED_OUTPATIENT_CLINIC_OR_DEPARTMENT_OTHER)
Admission: EM | Admit: 2018-01-21 | Discharge: 2018-01-21 | Disposition: A | Discharge status: Home / Self Care | Payer: BC Managed Care – PPO | Attending: Emergency Medicine | Admitting: Emergency Medicine

## 2018-01-21 DIAGNOSIS — K219 Gastro-esophageal reflux disease without esophagitis: Secondary | ICD-10-CM | POA: Insufficient documentation

## 2018-01-21 DIAGNOSIS — R079 Chest pain, unspecified: Secondary | ICD-10-CM | POA: Diagnosis present

## 2018-01-21 DIAGNOSIS — I10 Essential (primary) hypertension: Secondary | ICD-10-CM | POA: Insufficient documentation

## 2018-01-21 DIAGNOSIS — Z79899 Other long term (current) drug therapy: Secondary | ICD-10-CM | POA: Insufficient documentation

## 2018-01-21 DIAGNOSIS — R142 Eructation: Secondary | ICD-10-CM | POA: Insufficient documentation

## 2018-01-21 LAB — BASIC METABOLIC PANEL
ANION GAP: 8 (ref 5–15)
BUN: 18 mg/dL (ref 6–20)
CHLORIDE: 103 mmol/L (ref 98–111)
CO2: 30 mmol/L (ref 22–32)
Calcium: 9 mg/dL (ref 8.9–10.3)
Creatinine, Ser: 0.58 mg/dL (ref 0.44–1.00)
GFR calc Af Amer: >60 mL/min (ref >60)
GFR calc non Af Amer: >60 mL/min (ref >60)
Glucose, Bld: 98 mg/dL (ref 70–99)
POTASSIUM: 4.1 mmol/L (ref 3.5–5.1)
SODIUM: 141 mmol/L (ref 135–145)

## 2018-01-21 LAB — TROPONIN I: Troponin I: <0.03 ng/mL (ref <0.03)

## 2018-01-21 LAB — CBC
HCT: 44.8 % (ref 36.0–46.0)
Hemoglobin: 14.3 g/dL (ref 12.0–15.0)
MCH: 30.2 pg (ref 26.0–34.0)
MCHC: 31.9 g/dL (ref 30.0–36.0)
MCV: 94.7 fL (ref 80.0–100.0)
Platelets: 184 10*3/uL (ref 150–400)
RBC: 4.73 MIL/uL (ref 3.87–5.11)
RDW: 13.5 % (ref 11.5–15.5)
WBC: 5.9 10*3/uL (ref 4.0–10.5)
nRBC: 0 % (ref 0.0–0.2)

## 2018-01-21 MED ORDER — ESOMEPRAZOLE MAGNESIUM 40 MG PO CPDR: 40.0000 mg | DELAYED_RELEASE_CAPSULE | Freq: Every day | ORAL | 0 refills | Status: DC

## 2018-01-21 NOTE — ED Provider Notes (Signed)
[facility] EMERGENCY DEPARTMENT Provider Note   CSN: [id number] Arrival date & time: 01/21/18  0901     History   Chief Complaint Chief Complaint  Patient presents with  . Chest Pain    HPI Mary Montgomery is a 62 y.o. female.  HPI She presents the emergency room for evaluation of chest pain.  Patient states she said some intermittent pressure episodes of pain in the center and left side of her chest for the last week or so.  Yesterday however she started having a constant pressure that persisted throughout the entire day.  She still had the symptoms when she went to sleep and this morning when she woke up it was still there so she decided to come in to get evaluated.  She has noticed some mild burping and she did have some acid sensation when she drank her coffee this morning.  She has not had any shortness of breath.  No dyspnea on exertion.  No trouble with leg swelling.  No fevers chills or cough.  She does not have a history of heart disease or lung disease. Past Medical History:  Diagnosis Date  . Arthritis    right knee  . Bulging disc    Lumber 3/4  . Degenerated intervertebral disc    neck  . Hay fever   . Hyperlipemia   . Hypertension   . Plantar fasciitis    bilaterl feet    Patient Active Problem List   Diagnosis Date Noted  . Hyperlipidemia 01/09/2014  . Routine general medical examination at a health care facility 10/07/2013  . Elevated BP 10/07/2013  . Morbid obesity (HCC) 10/07/2013    Past Surgical History:  Procedure Laterality Date  . CYSTECTOMY  1988   polynidal cyst  . WISDOM TOOTH EXTRACTION  1977     OB History   None      Home Medications    Prior to Admission medications   Medication Sig Start Date End Date Taking? Authorizing Provider  cycloSPORINE (RESTASIS) 0.05 % ophthalmic emulsion 1 drop 2 (two) times daily.   Yes [provider]  cholecalciferol (VITAMIN D) 1000 UNITS tablet Take 1,000 Units by mouth  daily.    [provider]  esomeprazole (NEXIUM) 40 MG capsule Take 1 capsule (40 mg total) by mouth daily. 01/21/18   Linwood Dibbles, MD  ibuprofen (ADVIL,MOTRIN) 200 MG tablet Take 200 mg by mouth every 6 (six) hours as needed.    [provider]  Multiple Vitamin (MULTIVITAMIN) tablet Take 1 tablet by mouth daily.    [provider]  PAU D ARCO PO Take 4 tablets by mouth daily.    [provider]  Red Yeast Rice Extract (RED YEAST RICE PO) Take by mouth.    [provider]    Family History Family History  Problem Relation Age of Onset  . Diabetes Mother   . Hypertension Mother   . Hyperlipidemia Mother   . Dementia Mother   . Parkinsonism Father   . Arthritis Father        and father side of family  . Coarctation of the aorta Brother     Social History Social History   Tobacco Use  . Smoking status: Never Smoker  . Smokeless tobacco: Never Used  Substance Use Topics  . Alcohol use: No  . Drug use: No     Allergies   Patient has no known allergies.   Review of Systems Review of Systems  All other systems reviewed and are negative.    Physical Exam Updated Vital Signs BP 134/77   Pulse (!) 52   Temp 97.9 F (36.6 C) (Oral)   Resp 16   Ht 1.6 m (5\' 3" )   Wt 117.9 kg   LMP 10/01/2013   SpO2 97%   BMI 46.06 kg/m   Physical Exam  Constitutional: She appears well-developed and well-nourished. No distress.  HENT:  Head: Normocephalic and atraumatic.  Right Ear: External ear normal.  Left Ear: External ear normal.  Eyes: Conjunctivae are normal. Right eye exhibits no discharge. Left eye exhibits no discharge. No scleral icterus.  Neck: Neck supple. No tracheal deviation present.  Cardiovascular: Normal rate, regular rhythm and intact distal pulses.  Pulmonary/Chest: Effort normal and breath sounds normal. No stridor. No respiratory distress. She has no wheezes. She has no rales.  Abdominal: Soft. Bowel sounds are  normal. She exhibits no distension. There is no tenderness. There is no rebound and no guarding.  Musculoskeletal: She exhibits no edema or tenderness.  Neurological: She is alert. She has normal strength. No cranial nerve deficit (no facial droop, extraocular movements intact, no slurred speech) or sensory deficit. She exhibits normal muscle tone. She displays no seizure activity. Coordination normal.  Skin: Skin is warm and dry. No rash noted.  Psychiatric: She has a normal mood and affect.  Nursing note and vitals reviewed.    ED Treatments / Results  Labs (all labs ordered are listed, but only abnormal results are displayed) Labs Reviewed  BASIC METABOLIC PANEL  CBC  TROPONIN I  TROPONIN I    EKG EKG Interpretation  Date/Time:  Sunday January 21 2018 09:10:08 EDT Ventricular Rate:  62 PR Interval:    QRS Duration: 107 QT Interval:  422 QTC Calculation: 429 R Axis:   28 Text Interpretation:  Sinus rhythm Normal ECG No old tracing to compare Confirmed by Linwood Dibbles [phone]) on 01/21/2018 9:16:23 AM Also confirmed by Linwood Dibbles [phone]), editor Barbette Hair [phone])  on 01/21/2018 9:47:01 AM   Radiology Dg Chest 2 View  Result Date: 01/21/2018 CLINICAL DATA:  Left-sided chest pain and pressure 2 days. EXAM: CHEST - 2 VIEW COMPARISON:  None. FINDINGS: Lungs are adequately inflated and otherwise clear. Cardiomediastinal silhouette and remainder of the exam is unremarkable. IMPRESSION: No active cardiopulmonary disease. Electronically Signed   By: Elberta Fortis M.D.   On: 01/21/2018 09:41    Procedures Procedures (including critical care time)  Medications Ordered in ED Medications - No data to display   Initial Impression / Assessment and Plan / ED Course  I have reviewed the triage vital signs and the nursing notes.  Pertinent labs & imaging results that were available during my care of the patient were reviewed by me and considered in my medical decision making (see  chart for details).   Patient presented to the emergency room for evaluation of chest pain.  She had some symptoms suggestive of gastroesophageal reflux.  ED work-up is reassuring.  Serial troponins, EKG and chest x-ray are reassuring.  No symptoms or risk factors to suggest PE.  Patient is low risk heart score.  I doubt acute coronary syndrome.  Plan on discharge home with prescription for proton pump inhibitor.  Follow-up with your primary care doctor.  Return for worsening symptoms. Warning signs and precautions discussed.  Final Clinical Impressions(s) / ED Diagnoses   Final diagnoses:  Chest pain, unspecified type  Gastroesophageal reflux disease, esophagitis presence not specified  ED Discharge Orders         Ordered    esomeprazole (NEXIUM) 40 MG capsule  Daily     01/21/18 1252           Linwood Dibbles, MD 01/21/18 1254

## 2018-01-21 NOTE — ED Notes (Signed)
ED Provider at bedside. 

## 2018-01-21 NOTE — ED Triage Notes (Signed)
Intermittent L side chest pressure x 1 week. States it has been constant since yesterday. Denies SOB, N/V

## 2018-01-21 NOTE — Discharge Instructions (Addendum)
Take the antacid medications as prescribed, follow-up with your primary care doctor next week to be rechecked if symptoms persist, return to the emergency room for worsening symptoms

## 2018-05-29 ENCOUNTER — Encounter: Payer: BC Managed Care – PPO | Admitting: Family Medicine

## 2018-10-15 ENCOUNTER — Other Ambulatory Visit (HOSPITAL_BASED_OUTPATIENT_CLINIC_OR_DEPARTMENT_OTHER): Payer: Self-pay | Admitting: Family Medicine

## 2018-10-15 DIAGNOSIS — Z1231 Encounter for screening mammogram for malignant neoplasm of breast: Secondary | ICD-10-CM

## 2018-10-16 ENCOUNTER — Ambulatory Visit (HOSPITAL_BASED_OUTPATIENT_CLINIC_OR_DEPARTMENT_OTHER)
Admission: RE | Admit: 2018-10-16 | Discharge: 2018-10-16 | Disposition: A | Discharge status: Home / Self Care | Payer: BC Managed Care – PPO | Source: Ambulatory Visit | Attending: Family Medicine | Admitting: Family Medicine

## 2018-10-16 ENCOUNTER — Encounter (HOSPITAL_BASED_OUTPATIENT_CLINIC_OR_DEPARTMENT_OTHER): Payer: Self-pay

## 2018-10-16 ENCOUNTER — Other Ambulatory Visit: Payer: Self-pay

## 2018-10-16 DIAGNOSIS — Z1231 Encounter for screening mammogram for malignant neoplasm of breast: Secondary | ICD-10-CM | POA: Diagnosis not present

## 2019-02-13 ENCOUNTER — Other Ambulatory Visit (HOSPITAL_COMMUNITY)
Admission: RE | Admit: 2019-02-13 | Discharge: 2019-02-13 | Disposition: A | Discharge status: Home / Self Care | Payer: BC Managed Care – PPO | Source: Ambulatory Visit | Attending: Family Medicine | Admitting: Family Medicine

## 2019-02-13 ENCOUNTER — Other Ambulatory Visit: Payer: Self-pay

## 2019-02-13 ENCOUNTER — Encounter: Payer: Self-pay | Admitting: Family Medicine

## 2019-02-13 ENCOUNTER — Ambulatory Visit (INDEPENDENT_AMBULATORY_CARE_PROVIDER_SITE_OTHER): Payer: BC Managed Care – PPO | Admitting: Family Medicine

## 2019-02-13 VITALS — BP 124/73 | HR 80 | Temp 98.3°F | Resp 16 | Ht 63.0 in | Wt 258.5 lb

## 2019-02-13 DIAGNOSIS — Z0000 Encounter for general adult medical examination without abnormal findings: Secondary | ICD-10-CM

## 2019-02-13 DIAGNOSIS — Z124 Encounter for screening for malignant neoplasm of cervix: Secondary | ICD-10-CM | POA: Diagnosis not present

## 2019-02-13 DIAGNOSIS — E559 Vitamin D deficiency, unspecified: Secondary | ICD-10-CM

## 2019-02-13 DIAGNOSIS — Z9189 Other specified personal risk factors, not elsewhere classified: Secondary | ICD-10-CM

## 2019-02-13 DIAGNOSIS — Z1159 Encounter for screening for other viral diseases: Secondary | ICD-10-CM

## 2019-02-13 HISTORY — DX: Vitamin D deficiency, unspecified: E55.9

## 2019-02-13 LAB — HEPATIC FUNCTION PANEL
ALT: 20 U/L (ref 0–35)
AST: 18 U/L (ref 0–37)
Albumin: 4.3 g/dL (ref 3.5–5.2)
Alkaline Phosphatase: 58 U/L (ref 39–117)
Bilirubin, Direct: 0.1 mg/dL (ref 0.0–0.3)
Total Bilirubin: 0.5 mg/dL (ref 0.2–1.2)
Total Protein: 6.5 g/dL (ref 6.0–8.3)

## 2019-02-13 LAB — CBC WITH DIFFERENTIAL/PLATELET
Basophils Absolute: 0.1 10*3/uL (ref 0.0–0.1)
Basophils Relative: 1.3 % (ref 0.0–3.0)
Eosinophils Absolute: 0.1 10*3/uL (ref 0.0–0.7)
Eosinophils Relative: 1.2 % (ref 0.0–5.0)
HCT: 43.3 % (ref 36.0–46.0)
Hemoglobin: 14.3 g/dL (ref 12.0–15.0)
Lymphocytes Relative: 24.5 % (ref 12.0–46.0)
Lymphs Abs: 1.1 10*3/uL (ref 0.7–4.0)
MCHC: 33 g/dL (ref 30.0–36.0)
MCV: 92 fl (ref 78.0–100.0)
Monocytes Absolute: 0.3 10*3/uL (ref 0.1–1.0)
Monocytes Relative: 6.8 % (ref 3.0–12.0)
Neutro Abs: 3.1 10*3/uL (ref 1.4–7.7)
Neutrophils Relative %: 66.2 % (ref 43.0–77.0)
Platelets: 178 10*3/uL (ref 150.0–400.0)
RBC: 4.71 Mil/uL (ref 3.87–5.11)
RDW: 13.8 % (ref 11.5–15.5)
WBC: 4.7 10*3/uL (ref 4.0–10.5)

## 2019-02-13 LAB — LIPID PANEL
Cholesterol: 266 mg/dL — ABNORMAL HIGH (ref 0–200)
HDL: 79.7 mg/dL (ref >39.00)
LDL Cholesterol: 173 mg/dL — ABNORMAL HIGH (ref 0–99)
NonHDL: 186.09
Total CHOL/HDL Ratio: 3
Triglycerides: 64 mg/dL (ref 0.0–149.0)
VLDL: 12.8 mg/dL (ref 0.0–40.0)

## 2019-02-13 LAB — BASIC METABOLIC PANEL
BUN: 15 mg/dL (ref 6–23)
CO2: 32 mEq/L (ref 19–32)
Calcium: 9.1 mg/dL (ref 8.4–10.5)
Chloride: 103 mEq/L (ref 96–112)
Creatinine, Ser: 0.58 mg/dL (ref 0.40–1.20)
GFR: 105.53 mL/min (ref >60.00)
Glucose, Bld: 93 mg/dL (ref 70–99)
Potassium: 4.3 mEq/L (ref 3.5–5.1)
Sodium: 142 mEq/L (ref 135–145)

## 2019-02-13 LAB — TSH: TSH: 1.42 u[IU]/mL (ref 0.35–4.50)

## 2019-02-13 LAB — VITAMIN D 25 HYDROXY (VIT D DEFICIENCY, FRACTURES): VITD: 55.38 ng/mL (ref 30.00–100.00)

## 2019-02-13 NOTE — Progress Notes (Signed)
   Subjective:    Patient ID: Mary Montgomery, female    DOB: 05-05-1955, 63 y.o.   MRN: [id number]  HPI CPE- pt has not had CPE since 2016.  UTD on mammo, due for pap.  UTD on colonoscopy.  UTD on Tdap, flu.  She is down 30 lbs since last visit- is doing whole 30.   Review of Systems Patient reports no vision/ hearing changes, adenopathy,fever, persistant/recurrent hoarseness , swallowing issues, chest pain, palpitations, edema, persistant/recurrent cough, hemoptysis, dyspnea (rest/exertional/paroxysmal nocturnal), gastrointestinal bleeding (melena, rectal bleeding), abdominal pain, significant heartburn, bowel changes, GU symptoms (dysuria, hematuria, incontinence), Gyn symptoms (abnormal  bleeding, pain),  syncope, focal weakness, memory loss, numbness & tingling, skin/hair/nail changes, abnormal bruising or bleeding, anxiety, or depression.     Objective:   Physical Exam  General Appearance:    Alert, cooperative, no distress, appears stated age  Head:    Normocephalic, without obvious abnormality, atraumatic  Eyes:    PERRL, conjunctiva/corneas clear, EOM's intact, fundi    benign, both eyes  Ears:    Normal TM's and external ear canals, both ears  Nose:   Deferred due to COVID  Throat:   Neck:   Supple, symmetrical, trachea midline, no adenopathy;    Thyroid: no enlargement/tenderness/nodules  Back:     Symmetric, no curvature, ROM normal, no CVA tenderness  Lungs:     Clear to auscultation bilaterally, respirations unlabored  Chest Wall:    No tenderness or deformity   Heart:    Regular rate and rhythm, S1 and S2 normal, no murmur, rub   or gallop  Breast Exam:    No tenderness, masses, or nipple abnormality  Abdomen:     Soft, non-tender, bowel sounds active all four quadrants,    no masses, no organomegaly  Genitalia:    External genitalia normal, cervix normal in appearance, no CMT, uterus in normal size and position, adnexa w/out mass or tenderness, mucosa pink and moist,  no lesions or discharge present  Rectal:    Normal external appearance  Extremities:   Extremities normal, atraumatic, no cyanosis or edema  Pulses:   2+ and symmetric all extremities  Skin:   Skin color, texture, turgor normal, no rashes or lesions  Lymph nodes:   Cervical, supraclavicular, and axillary nodes normal  Neurologic:   CNII-XII intact, normal strength, sensation and reflexes    throughout          Assessment & Plan:

## 2019-02-13 NOTE — Patient Instructions (Signed)
Follow up in 6 months to recheck weight loss progress We'll notify you of your lab results and make any changes if needed Continue to work on healthy diet and regular exercise- you're doing great! Call with any questions or concerns Stay Safe!  Stay Healthy!! 

## 2019-02-13 NOTE — Assessment & Plan Note (Signed)
Pt's PE WNL w/ exception of obesity.  UTD on mammo, colonoscopy, immunizations.  Pap collected today.  Check labs.  Anticipatory guidance provided.

## 2019-02-13 NOTE — Assessment & Plan Note (Signed)
Ongoing issue for pt.  She is actually down 30 lbs in the last 2 yrs.  Applauded her efforts.  Check labs to risk stratify.  Will follow.

## 2019-02-13 NOTE — Assessment & Plan Note (Signed)
Pt has hx of this.  Check labs and replete prn. 

## 2019-02-14 ENCOUNTER — Other Ambulatory Visit: Payer: Self-pay | Admitting: General Practice

## 2019-02-14 DIAGNOSIS — E785 Hyperlipidemia, unspecified: Secondary | ICD-10-CM

## 2019-02-14 LAB — HEPATITIS C ANTIBODY
Hepatitis C Ab: NONREACTIVE
SIGNAL TO CUT-OFF: 0.01 (ref <1.00)

## 2019-02-14 MED ORDER — ROSUVASTATIN CALCIUM 10 MG PO TABS: 10.0000 mg | ORAL_TABLET | Freq: Every day | ORAL | 6 refills | Status: DC

## 2019-02-18 ENCOUNTER — Encounter: Payer: Self-pay | Admitting: General Practice

## 2019-02-18 LAB — CYTOLOGY - PAP
Comment: NEGATIVE
Diagnosis: NEGATIVE
Diagnosis: REACTIVE
High risk HPV: NEGATIVE

## 2019-02-21 ENCOUNTER — Encounter: Payer: Self-pay | Admitting: Family Medicine

## 2019-05-28 ENCOUNTER — Encounter: Payer: Self-pay | Admitting: Family Medicine

## 2019-05-28 DIAGNOSIS — I890 Lymphedema, not elsewhere classified: Secondary | ICD-10-CM

## 2019-06-10 ENCOUNTER — Ambulatory Visit: Payer: BC Managed Care – PPO | Attending: Family Medicine | Admitting: Occupational Therapy

## 2019-06-10 ENCOUNTER — Other Ambulatory Visit: Payer: Self-pay

## 2019-06-10 DIAGNOSIS — I890 Lymphedema, not elsewhere classified: Secondary | ICD-10-CM | POA: Diagnosis present

## 2019-06-10 NOTE — Patient Instructions (Signed)

## 2019-06-11 ENCOUNTER — Encounter: Payer: Self-pay | Admitting: Occupational Therapy

## 2019-06-11 NOTE — Therapy (Signed)
Mary Montgomery, Mary Montgomery, Mary Montgomery Phone: [phone]   Fax:  [phone]  Occupational Therapy Evaluation  Patient Details  Name: Mary Montgomery MRN: [id number] Date of Birth: 1955-05-03 Referring Provider (OT): Neena Rhymes, MD   Encounter Date: 06/10/2019  OT End of Session - 06/10/19 1552    Visit Number  1    Number of Visits  36    Date for OT Re-Evaluation  09/08/19    OT Start Time  0310    OT Stop Time  0430    OT Time Calculation (min)  80 min    Activity Tolerance  Patient tolerated treatment well;No increased pain    Behavior During Therapy  WFL for tasks assessed/performed       Past Medical History:  Diagnosis Date  . Arthritis    right knee  . Bulging disc    Lumber 3/4  . Degenerated intervertebral disc    neck  . Hay fever   . Hyperlipemia   . Hypertension   . Plantar fasciitis    bilaterl feet    Past Surgical History:  Procedure Laterality Date  . CYSTECTOMY  1988   polynidal cyst  . WISDOM TOOTH EXTRACTION  1977    There were no vitals filed for this visit.  Subjective Assessment - 06/11/19 1531    Subjective   Mary Montgomery is referred to Occupational Therapy for evaluation and treatment of BLE lymphedema by Sande Rives, MD. Pt reports her legs have always been large as long as she can remember. She endorses positive family history of leg swelling in her mother and father. She is unsure if she has ever had an episodes of cellulitis. Mary Montgomery, Mary the shelf compression knee highs daily Her s goals for Occupational Therapy are to get the swelling down in her legs so she can do more and feel better.    Pertinent History  Dermatologist ID'd lipedema in ~ 11/2018; Recent 30 #weight less, +family hx of leg swelling-father,    Limitations  chronic leg swelling and pain, limit functional ambulation and transfers, hx plantar fasciitis,  OA, decreased balance    Repetition  Increases Symptoms    Special Tests  negative Stemmer sign base of toes bilaterally    Patient Stated Goals  learn about lymphedema and find out what I can do with it to improve my health    Pain Onset  Other (comment)   childhood       [facility] OT Assessment - 06/11/19 0001      Assessment   Medical Diagnosis  Moderate, Stage II, 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   No    Is the patient reluctant to leave their home because of a fear of falling?   No      Home  Environment   Family/patient expects to be discharged to:  Private residence    Living Arrangements  Spouse/significant other    Available Help at Discharge  Family    Type of Home  House    Home Access  Stairs    Home Layout  One level    Bathroom Shower/Tub  Tub/Shower unit;Curtain    Shower/tub characteristics  Curtain    Aeronautical engineer  Yes    How accessible  Accessible via walker    Home Equipment  Grab bars - toilet    Lives With  Spouse      Prior Function   Level of Independence  Independent;Independent with basic ADLs;Independent with household mobility without device;Independent with community mobility without device;Independent with gait;Independent with transfers    Vocation  Full time employment    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, walking, lifting, carrying, bending, crouching    Leisure  family      IADL   Prior Level of Function Shopping  I    Shopping  Takes care of all shopping needs  independently   seated rest breaks to releive leg pain if > 1 hr   Prior Level of Function Light Housekeeping  I    Light Housekeeping  Does personal laundry completely;Launders small items, rinses stockings, etc.;Maintains house alone or with occasional assistance;Performs light daily tasks such as dishwashing, bed making   needs assistance with heavy home management tasks and yard w   Prior Level of Function Meal Prep  I    Meal Prep  Plans, prepares and serves adequate meals independently   seated rest breaks in kitchen   Prior Level of Function Community Mobility  I    Education officer, environmental own vehicle   walking breaks frequently when driving long distance     Mobility   Mobility Status  Independent      Cognition   Overall Cognitive Status  Within Functional Limits for tasks assessed      Posture/Postural Control   Posture/Postural Control  No significant limitations      Sensation   Light Touch  Appears Intact      Coordination   Gross Motor Movements are Fluid and Coordinated  Yes       LYMPHEDEMA/ONCOLOGY QUESTIONNAIRE - 06/11/19 1512      Lymphedema Assessments   Lymphedema Assessments  Lower extremities       Moderate, Stage II, BLE Lipo-Lymphedema 2/2 sLipedema, Type III Skin  Description Hyper-Keratosis Peau' de Orange Shiny Tight Fibrotic Fatty Doughy Indurated       x x x      Skin dry Flaky Erythema Macerated   x      Color Redness Present Pallor Blanching Hemosiderin Staining Other           Odor Malodorous Yeast Fungal infection  Absent      x   Temperature Warm Cool wnl     x   Pitting Edema   1+ 2+ 3+ 4+ Non-pitting        x   Girth Symmetrical Asymmetrical Other Distribution   x   "Pantaloon" with feet spared   Stemmer Sign Positive Negative      BLE   Lymphorrea History Of:  Present Absent      x   Wounds History Of Present Absent Venous Arterial Pressure Size      X          Signs of Infection Redness Warmth  Erythema Acute Swelling  Drainage Borders                   Scars   Adhesions Hypersensitivity          Sensation Light Touch Deep pressure Hypersensitivty   Present Impaired Present Impaired Absent Impaired   x  x  x   x  Nails WNL Fungus Other   x    x Hair Growth Symmetrical Asymmetrical   X    Skin Creases Base of toes  Ankles   Base of Fingers Medial Thighs         Abdominal pannus Medial legs      x                 OT Treatments/Exercises (OP) - 06/11/19 0001      Transfers   Transfers  Sit to Stand;Stand to Sit    Sit to Stand  6: Modified independent (Device/Increase time)    Stand to Sit  6: Modified independent (Device/Increase time)      ADLs   Overall ADLs  all basoic and instrumental ADLs requiring walking, standing and/or extended dependent sitting are limited by leg pain and sweling when > 1 hr    LB Dressing  difficulty fitting street shoes and LB clothing due to limb girth 2/2 swelling    ADL Education Given  Yes            OT Education - 06/11/19 1512    Education Details  Provided Pt and family education regarding lymphatic structure and function, etiologies, onset patterns and stages of progression. Discussed  impact of obesity on lymphatic function. Outlined Complete Decongestive Therapy (CDT)  as standard of care and provided in depth information regarding 4 primary components of both Intensive and Self Management Phases, including Manual Lymph Drainage (MLD), compression wrapping and garments, skin care, and therapeutic exercise.   Homero Fellers discussion of high burden of care,  Discussed  Importance of daily, ongoing LE self-care essential to retaining clinical gains and limiting progression.  Lastly, reviewed lymphedema precautions, including cellulitis risk and difficulty with wound healing. Provided printed Lymphedema Workbook for reference.    Person(s) Educated  Patient    Methods  Explanation;Demonstration;Handout    Comprehension   Verbalized understanding;Returned demonstration;Need further instruction          OT Long Term Goals - 06/11/19 1514      OT LONG TERM GOAL #1   Title  Pt will be able to apply BLE, knee length, multi-layer, short stretch compression wraps daily to one leg at a time using correct gradient techniques with modified assistance (extra time)  to achieve optimal limb volume reduction, to return affected limb/s, as closely as possible, to premorbid size and shape, to limit infection risk, and to improve safe functional ambulation and mobility.    Baseline  dependent    Time  4    Period  Days    Status  New    Target Date  --   4th OT Rx visit     OT LONG TERM GOAL #2   Title  Pt will be able to verbalize signs and symptoms of cellulitis infection and identify 4 common lymphedema precautions using printed resource for reference to limit LE progression over time.    Baseline  Max A    Time  4    Period  Days    Status  New    Target Date  --   4th OT Rx visit  OT LONG TERM GOAL #3   Title  Pt will achieve and sustain no less than  85%  compliance with daily LE self-care home program (skin care, lymphatic pumping therex, compression and simple self-MLD) during Intensive Phase CDT to limit limb swelling, reduce infection risk and limit LE progression.    Baseline  Max A    Time  12    Period  Weeks    Status  New    Target Date  09/08/19      OT LONG TERM GOAL #4   Title  Pt to achieve at least 10% BLE limb volume reduction in BLE below the knees, during Intensive Phase CDT to improve safe functional mobility and ambulation, to improve functional performance of basic and instrumental ADLs, to limit infection risk and chronic LE progression.    Baseline  Max A    Time  12    Period  Weeks    Status  New    Target Date  09/08/19      OT LONG TERM GOAL #5   Title  Once issued Pt will be able to don and doff appropriate compression garments and/ or devices using correct techniques  and assistive devices with modified independence and extra time for optimal LE management to limit progression over time.    Baseline  Max A    Time  12    Period  Weeks    Status  New    Target Date  09/08/19      Long Term Additional Goals   Additional Long Term Goals  Yes      OT LONG TERM GOAL #6   Title  During self-management phase of CDT Pt will retain limb volume reductions achieved during Intensive Phase CDT with no more than 3% volume increase to limit LE progression and further functional decline.    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 Chronic, progressive leg swelling and associated pain limits Mrs. Koleen NimrodKowalkski's sability to perform functional activities in all occupational domains, including basic and instrumental ADLs, productive activities and leisure pursuits, social participation and role performance. Appearance of swollen legs limits her body image and self-esteem, clothing selection and participation in preferred exercise and social activities.  Mrs Madaline GuthrieKoowalski will benefit from skilled Occupational Therapy for Intensive and Management Phase Complete Decongestive Therapy (CDT) to include manual lymphatic drainage, skin care, therapeutic exercise and compression therapy. The goals of Intensive CDT is to redu8ce swelling, decrease associated pain, improve functional performance, and limit lymphedema progression. Emphasis throughout OT course will also focus on Pt education for long term LE self-management. Without skilled OT for CDT LE will progress resulting in progression of the condition, increased    infection risk and further functional decline.    OT Occupational Profile and History  Comprehensive Assessment- Review of records and  extensive additional review of physical, cognitive, psychosocial history related to current functional performance    Occupational performance deficits (Please refer to evaluation for details):  ADL's;Work;IADL's;Leisure;Rest and Sleep;Social Participation;Other   body image, self esteme   Body Structure / Function / Physical Skills  ADL;ROM;Obesity;IADL;Edema;Balance;Decreased knowledge of precautions;Gait;Pain;Skin  integrity;Mobility;Decreased knowledge of use of DME    Rehab Potential  Good    Clinical Decision Making  Several treatment options, min-mod task modification necessary    Comorbidities Affecting Occupational Performance:  Presence of comorbidities impacting occupational performance    Comorbidities impacting occupational performance description:  lipidema, suspected CVI    Modification or Assistance to Complete Evaluation   Min-Moderate modification of tasks or assist with assess necessary to complete eval    OT Frequency  2x / week    OT Duration  12 weeks    OT Treatment/Interventions  Self-care/ADL training;Therapeutic exercise;Functional Mobility Training;Manual lymph drainage;Therapeutic activities;Other (comment);Compression bandaging;Manual Therapy;DME and/or AE instruction   skin care with low ph Raoul Pitch (castor oil) and compression garment / device measurement and fitting   Plan  CDT: manual lymphatic drainage (MLD) . skin care, lymphatic pumping ther ex, compression with multilayer  , short stretch compression wraps-single leg at a time , knee length. Fit with FLAT KNIT, CUSTOM<, KNEE LENGTH, ccl3 (35-45 mmHg) JOBST ELVAREX CLASSIC ELASTI COMPRESSION STOCKINgs for full time daily use. Consider fitting with [employer], Flexitouch, sequential pneumatic compression device, or "pump" for optimal LE self management over time at home. The 32-chamber Flexitouch is the only available sequential pneumatic compression device providing proximal to distal lymphatic fluid return  via regional lymph nodes (LN) deep abdominal pathways and the thoracic duct to the heart. The basic pneumatic pump is not appropriate for this patient because it provides distal-to-proximal, retrograde massage mobilizing tissue fluid against back pressure which then abruptly ends before reaching regional LNs leaving dense, protein rich fluid at the joint.    Consulted and Agree with Plan of Care  Patient       Patient will benefit from skilled therapeutic intervention in order to improve the following deficits and impairments:   Body Structure / Function / Physical Skills: ADL, ROM, Obesity, IADL, Edema, Balance, Decreased knowledge of precautions, Gait, Pain, Skin integrity, Mobility, Decreased knowledge of use of DME       Visit Diagnosis: Lymphedema, not elsewhere classified - Plan: Ot plan of care cert/re-cert    Problem List Patient Active Problem List   Diagnosis Date Noted  . Vitamin D deficiency 02/13/2019  . Hyperlipidemia 01/09/2014  . Routine general medical examination at a health care facility 10/07/2013  . Elevated BP 10/07/2013  . Morbid obesity ([place]) 10/07/2013   Andrey Spearman, MS, OTR/L, [facility] 06/11/19 3:32 PM   [place] MAIN [facility] SERVICES [address] [place], [place], [address] Phone: [phone]   Fax:  [phone]  Name: DELANY STEURY MRN: [id number] Date of Birth: April 25, 1955

## 2019-06-11 NOTE — Therapy (Deleted)
[place] [facility] MAIN [facility] SERVICES [address] [place], [place], [address] Phone: [phone]   Fax:  [phone]  Patient Details  Name: Mary Montgomery MRN: [id number] Date of Birth: November 04, 1955 Referring Provider:  Sheliah Hatch, MD  Encounter Date: 06/10/2019   Judithann Sauger 06/11/2019, 3:25 PM  [place] [facility] MAIN [facility] SERVICES [address] [place], [place], [address] Phone: [phone]   Fax:  [phone]

## 2019-06-17 ENCOUNTER — Ambulatory Visit: Payer: BC Managed Care – PPO | Admitting: Occupational Therapy

## 2019-06-17 ENCOUNTER — Other Ambulatory Visit: Payer: Self-pay

## 2019-06-17 DIAGNOSIS — I890 Lymphedema, not elsewhere classified: Secondary | ICD-10-CM

## 2019-06-17 NOTE — Therapy (Signed)
[facility] MAIN [facility] SERVICES [address] [place], [place], [address] Phone: [phone]   Fax:  [phone]  Occupational Therapy Treatment  Patient Details  Name: Mary Montgomery MRN: [id number] Date of Birth: [birth year] Referring Provider (OT): Neena Rhymes, MD   Encounter Date: 06/17/2019  OT End of Session - 06/17/19 1633    Visit Number  2    Number of Visits  36    Date for OT Re-Evaluation  09/08/19    OT Start Time  0200    OT Stop Time  0300    OT Time Calculation (min)  60 min    Activity Tolerance  Patient tolerated treatment well;No increased pain    Behavior During Therapy  WFL for tasks assessed/performed       Past Medical History:  Diagnosis Date  . Arthritis    right knee  . Bulging disc    Lumber 3/4  . Degenerated intervertebral disc    neck  . Hay fever   . Hyperlipemia   . Hypertension   . Plantar fasciitis    bilaterl feet    Past Surgical History:  Procedure Laterality Date  . CYSTECTOMY  1988   polynidal cyst  . WISDOM TOOTH EXTRACTION  1977    There were no vitals filed for this visit.  Subjective Assessment - 06/17/19 1513    Subjective   Mary Montgomery presents for OT visit 2/36 to address BLE lymphedema. Today we commence intensive phase CDT to LLE. 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    Pertinent History  Dermatologist ID'd lipedema in ~ 11/2018; Recent 30 #weight less, +family hx of leg swelling-father,    Limitations  chronic leg swelling and pain, limit functional ambulation and transfers, hx plantar fasciitis, OA, decreased balance    Repetition  Increases Symptoms    Special Tests  negative Stemmer sign base of toes bilaterally    Patient Stated Goals  learn about lymphedema and find out what I can do with it to improve my health    Pain Onset  Other (comment)   childhood          LYMPHEDEMA/ONCOLOGY QUESTIONNAIRE - 06/17/19 1649      Right Lower Extremity Lymphedema   Other  RLE ankle to tibial tuberosity (A-D) limb volume = 6967.24 ml.      Left Lower Extremity Lymphedema   Other  Initial LLE A-D limb volume = 6967.24 ml.    Other  Limb volume differential is WNL at 1.56%, L>R, but this data does not clearly reveal that there is limb swelling bilaterall and swelling is symetrical, which is typical distribution for lio-lymphedema.              OT Treatments/Exercises (OP) - 06/17/19 0001      ADLs   ADL Education Given  Yes      Manual Therapy   Manual Therapy  Edema management;Compression Bandaging    Edema Management  BLE comparative limb volumetrics    Compression Bandaging  multiple layer , knee length , gradient compression wraps to LLE using 8,10 and 12 cm short stretch wraps over 0.4 cm thick RosiIdal foam             OT Education - 06/17/19 1631    Education Details  Pt edu for rational for comparative imb volumetrics as  outcomes measure for progress towads functional goals, and Pt's specific outcome  for initial measurements as current baseline. Pt educated re purpose of LEFS and LLIS as outcome measures for levels of function at present in the context of lymphedema in BLE    Person(s) Educated  Patient    Methods  Explanation;Demonstration;Handout    Comprehension  Verbalized understanding;Returned demonstration;Need further instruction          OT Long Term Goals - 06/11/19 1514      OT LONG TERM GOAL #1   Title  Pt will be able to apply BLE, knee length, multi-layer, short stretch compression wraps daily to one leg at a time using correct gradient techniques with modified assistance (extra time)  to achieve optimal limb volume reduction, to return affected limb/s, as closely as possible, to premorbid size and shape, to limit infection risk, and to improve safe functional ambulation and mobility.    Baseline  dependent     Time  4    Period  Days    Status  New    Target Date  --   4th OT Rx visit     OT LONG TERM GOAL #2   Title  Pt will be able to verbalize signs and symptoms of cellulitis infection and identify 4 common lymphedema precautions using printed resource for reference to limit LE progression over time.    Baseline  Max A    Time  4    Period  Days    Status  New    Target Date  --   4th OT Rx visit     OT LONG TERM GOAL #3   Title  Pt will achieve and sustain no less than  85%  compliance with daily LE self-care home program (skin care, lymphatic pumping therex, compression and simple self-MLD) during Intensive Phase CDT to limit limb swelling, reduce infection risk and limit LE progression.    Baseline  Max A    Time  12    Period  Weeks    Status  New    Target Date  09/08/19      OT LONG TERM GOAL #4   Title  Pt to achieve at least 10% BLE limb volume reduction in BLE below the knees, during Intensive Phase CDT to improve safe functional mobility and ambulation, to improve functional performance of basic and instrumental ADLs, to limit infection risk and chronic LE progression.    Baseline  Max A    Time  12    Period  Weeks    Status  New    Target Date  09/08/19      OT LONG TERM GOAL #5   Title  Once issued Pt will be able to don and doff appropriate compression garments and/ or devices using correct techniques and assistive devices with modified independence and extra time for optimal LE management to limit progression over time.    Baseline  Max A    Time  12    Period  Weeks    Status  New    Target Date  09/08/19      Long Term Additional Goals   Additional Long Term Goals  Yes      OT LONG TERM GOAL #6   Title  During self-management phase of CDT Pt will retain limb volume reductions achieved during Intensive Phase CDT with no more than 3% volume increase to limit LE progression and further functional decline.    Baseline  Max A    Time  6    Period  Months     Status  New    Target Date  12/08/19            Plan - 06/17/19 1634    Clinical Impression Statement  Initial lymphedema Life Impact Scale (LLIS) receals the extent to which the Pt feels lymphedema -related problems affected Pt during the last week measures 30.88%. The Lower Extremity Functional Scale (LEFS) reveals Pt currently has difficulty performing 45%  of 20 typical lower extremity functional activities. Pt initial BLE comparative limb volumetrics reveals limbs are indded symetrically swollen with limb volume differential of 1.56%, L>R. In addition to completing initial outcomes measures, Provided pt education for outcomes measures as well as intro level gradient compression wrapping techniques. Pt stated she felt comfortable in wraps, and she verbalized understanding of goal for initial application is to build tolerance. She will remove wraps if she is unable to tolerate them for 24 hours. Cont as per POC.    OT Occupational Profile and History  Comprehensive Assessment- Review of records and extensive additional review of physical, cognitive, psychosocial history related to current functional performance    Occupational performance deficits (Please refer to evaluation for details):  ADL's;Work;IADL's;Leisure;Rest and Sleep;Social Participation;Other   body image, self esteme   Body Structure / Function / Physical Skills  ADL;ROM;Obesity;IADL;Edema;Balance;Decreased knowledge of precautions;Gait;Pain;Skin integrity;Mobility;Decreased knowledge of use of DME    Rehab Potential  Good    Clinical Decision Making  Several treatment options, min-mod task modification necessary    Comorbidities Affecting Occupational Performance:  Presence of comorbidities impacting occupational performance    Comorbidities impacting occupational performance description:  lipidema, suspected CVI    Modification or Assistance to Complete Evaluation   Min-Moderate modification of tasks or assist with assess  necessary to complete eval    OT Frequency  2x / week    OT Duration  12 weeks    OT Treatment/Interventions  Self-care/ADL training;Therapeutic exercise;Functional Mobility Training;Manual lymph drainage;Therapeutic activities;Other (comment);Compression bandaging;Manual Therapy;DME and/or AE instruction   skin care with low ph Reeves Forth (castor oil) and compression garment / device measurement and fitting   Plan  CDT: manual lymphatic drainage (MLD) . skin care, lymphatic pumping ther ex, compression with multilayer  , short stretch compression wraps-single leg at a time , knee length. Fit with FLAT KNIT, CUSTOM<, KNEE LENGTH, ccl3 (35-45 mmHg) JOBST ELVAREX CLASSIC ELASTI COMPRESSION STOCKINgs for full time daily use. Consider fitting with [employer], Flexitouch, sequential pneumatic compression device, or "pump" for optimal LE self management over time at home. The 32-chamber Flexitouch is the only available sequential pneumatic compression device providing proximal to distal lymphatic fluid return via regional lymph nodes (LN) deep abdominal pathways and the thoracic duct to the heart. The basic pneumatic pump is not appropriate for this patient because it provides distal-to-proximal, retrograde massage mobilizing tissue fluid against back pressure which then abruptly ends before reaching regional LNs leaving dense, protein rich fluid at the joint.    Consulted and Agree with Plan of Care  Patient       Patient will benefit from skilled therapeutic intervention in order to improve the following deficits and impairments:   Body Structure / Function / Physical Skills: ADL, ROM, Obesity, IADL, Edema, Balance, Decreased knowledge of precautions, Gait, Pain, Skin integrity, Mobility, Decreased knowledge of use of DME       Visit Diagnosis: Lymphedema, not elsewhere classified    Problem List Patient  Active Problem List   Diagnosis Date Noted  . Vitamin D deficiency 02/13/2019  .  Hyperlipidemia 01/09/2014  . Routine general medical examination at a health care facility 10/07/2013  . Elevated BP 10/07/2013  . Morbid obesity ([place]) 10/07/2013    Andrey Spearman, MS, OTR/L, [facility] 06/17/19 4:51 PM  [place] MAIN [facility] SERVICES [address] [place], [place], [address] Phone: [phone]   Fax:  [phone]  Name: Mary Montgomery MRN: [id number] Date of Birth: [birth year]

## 2019-06-17 NOTE — Patient Instructions (Signed)

## 2019-06-20 ENCOUNTER — Ambulatory Visit: Payer: BC Managed Care – PPO | Admitting: Occupational Therapy

## 2019-06-24 ENCOUNTER — Other Ambulatory Visit: Payer: Self-pay

## 2019-06-24 ENCOUNTER — Ambulatory Visit: Payer: BC Managed Care – PPO | Admitting: Occupational Therapy

## 2019-06-24 DIAGNOSIS — I890 Lymphedema, not elsewhere classified: Secondary | ICD-10-CM | POA: Diagnosis not present

## 2019-06-24 NOTE — Therapy (Signed)
[place] [facility] MAIN [facility] SERVICES [address] [place], [place], [address] Phone: [phone]   Fax:  [phone]  Occupational Therapy Treatment  Patient Details  Name: Mary Montgomery MRN: [id number] Date of Birth: 11-05-1955 Referring Provider (OT): Neena Rhymes, MD   Encounter Date: 06/24/2019  OT End of Session - 06/24/19 1715    Visit Number  3    Number of Visits  36    Date for OT Re-Evaluation  09/08/19    OT Start Time  0314    OT Stop Time  0408    OT Time Calculation (min)  54 min    Activity Tolerance  Patient tolerated treatment well;No increased pain    Behavior During Therapy  WFL for tasks assessed/performed       Past Medical History:  Diagnosis Date  . Arthritis    right knee  . Bulging disc    Lumber 3/4  . Degenerated intervertebral disc    neck  . Hay fever   . Hyperlipemia   . Hypertension   . Plantar fasciitis    bilaterl feet    Past Surgical History:  Procedure Laterality Date  . CYSTECTOMY  1988   polynidal cyst  . WISDOM TOOTH EXTRACTION  1977    There were no vitals filed for this visit.  Subjective Assessment - 06/24/19 1518    Subjective   Mary Montgomery presents for OT visit 3/36 to address BLE lymphedema. Pt missed last visit due to inclement weather. Pt brings wraps to clinic. Pt reorts she tolerated compression wraps applied at 1st treatment session pretty well except for an area of thightness at ankle.    Pertinent History  Dermatologist ID'd lipedema in ~ 11/2018; Recent 30 #weight less, +family hx of leg swelling-father,    Limitations  chronic leg swelling and pain, limit functional ambulation and transfers, hx plantar fasciitis, OA, decreased balance    Repetition  Increases Symptoms    Special Tests  negative Stemmer sign base of toes bilaterally    Patient Stated Goals  learn about lymphedema and find out what I can do with it to improve my health    Pain Onset  Other  (comment)   childhood                          OT Education - 06/24/19 1714    Education Details  After skilled ADL training Pt was able to aply LLE knee  length, gradient compression wrap with Max assist, including verbal and tactile cues and demonstration with multiple opportunities to practice.    Person(s) Educated  Patient    Methods  Explanation;Demonstration;Tactile cues;Verbal cues    Comprehension  Verbalized understanding;Returned demonstration;Need further instruction;Verbal cues required;Tactile cues required          OT Long Term Goals - 06/11/19 1514      OT LONG TERM GOAL #1   Title  Pt will be able to apply BLE, knee length, multi-layer, short stretch compression wraps daily to one leg at a time using correct gradient techniques with modified assistance (extra time)  to achieve optimal limb volume reduction, to return affected limb/s, as closely as possible, to premorbid size and shape, to limit infection risk, and to improve safe functional ambulation and mobility.    Baseline  dependent    Time  4    Period  Days    Status  New  Target Date  --   4th OT Rx visit     OT LONG TERM GOAL #2   Title  Pt will be able to verbalize signs and symptoms of cellulitis infection and identify 4 common lymphedema precautions using printed resource for reference to limit LE progression over time.    Baseline  Max A    Time  4    Period  Days    Status  New    Target Date  --   4th OT Rx visit     OT LONG TERM GOAL #3   Title  Pt will achieve and sustain no less than  85%  compliance with daily LE self-care home program (skin care, lymphatic pumping therex, compression and simple self-MLD) during Intensive Phase CDT to limit limb swelling, reduce infection risk and limit LE progression.    Baseline  Max A    Time  12    Period  Weeks    Status  New    Target Date  09/08/19      OT LONG TERM GOAL #4   Title  Pt to achieve at least 10% BLE limb volume  reduction in BLE below the knees, during Intensive Phase CDT to improve safe functional mobility and ambulation, to improve functional performance of basic and instrumental ADLs, to limit infection risk and chronic LE progression.    Baseline  Max A    Time  12    Period  Weeks    Status  New    Target Date  09/08/19      OT LONG TERM GOAL #5   Title  Once issued Pt will be able to don and doff appropriate compression garments and/ or devices using correct techniques and assistive devices with modified independence and extra time for optimal LE management to limit progression over time.    Baseline  Max A    Time  12    Period  Weeks    Status  New    Target Date  09/08/19      Long Term Additional Goals   Additional Long Term Goals  Yes      OT LONG TERM GOAL #6   Title  During self-management phase of CDT Pt will retain limb volume reductions achieved during Intensive Phase CDT with no more than 3% volume increase to limit LE progression and further functional decline.    Baseline  Max A    Time  6    Period  Months    Status  New    Target Date  12/08/19            Plan - 06/24/19 1714    Clinical Impression Statement  After skilled ADL training Pt was able to aply LLE knee  length, gradient compression wrap with Max assist, including verbal and tactile cues and demonstration with multiple opportunities to practice. Cont as per POC.    OT Occupational Profile and History  Comprehensive Assessment- Review of records and extensive additional review of physical, cognitive, psychosocial history related to current functional performance    Occupational performance deficits (Please refer to evaluation for details):  ADL's;Work;IADL's;Leisure;Rest and Sleep;Social Participation;Other   body image, self esteme   Body Structure / Function / Physical Skills  ADL;ROM;Obesity;IADL;Edema;Balance;Decreased knowledge of precautions;Gait;Pain;Skin integrity;Mobility;Decreased knowledge of  use of DME    Rehab Potential  Good    Clinical Decision Making  Several treatment options, min-mod task modification necessary    Comorbidities Affecting Occupational  Performance:  Presence of comorbidities impacting occupational performance    Comorbidities impacting occupational performance description:  lipidema, suspected CVI    Modification or Assistance to Complete Evaluation   Min-Moderate modification of tasks or assist with assess necessary to complete eval    OT Frequency  2x / week    OT Duration  12 weeks    OT Treatment/Interventions  Self-care/ADL training;Therapeutic exercise;Functional Mobility Training;Manual lymph drainage;Therapeutic activities;Other (comment);Compression bandaging;Manual Therapy;DME and/or AE instruction   skin care with low ph Reeves Forth (castor oil) and compression garment / device measurement and fitting   Plan  CDT: manual lymphatic drainage (MLD) . skin care, lymphatic pumping ther ex, compression with multilayer  , short stretch compression wraps-single leg at a time , knee length. Fit with FLAT KNIT, CUSTOM<, KNEE LENGTH, ccl3 (35-45 mmHg) JOBST ELVAREX CLASSIC ELASTI COMPRESSION STOCKINgs for full time daily use. Consider fitting with [employer], Flexitouch, sequential pneumatic compression device, or "pump" for optimal LE self management over time at home. The 32-chamber Flexitouch is the only available sequential pneumatic compression device providing proximal to distal lymphatic fluid return via regional lymph nodes (LN) deep abdominal pathways and the thoracic duct to the heart. The basic pneumatic pump is not appropriate for this patient because it provides distal-to-proximal, retrograde massage mobilizing tissue fluid against back pressure which then abruptly ends before reaching regional LNs leaving dense, protein rich fluid at the joint.    Consulted and Agree with Plan of Care  Patient       Patient will benefit from skilled therapeutic  intervention in order to improve the following deficits and impairments:   Body Structure / Function / Physical Skills: ADL, ROM, Obesity, IADL, Edema, Balance, Decreased knowledge of precautions, Gait, Pain, Skin integrity, Mobility, Decreased knowledge of use of DME       Visit Diagnosis: Lymphedema, not elsewhere classified    Problem List Patient Active Problem List   Diagnosis Date Noted  . Vitamin D deficiency 02/13/2019  . Hyperlipidemia 01/09/2014  . Routine general medical examination at a health care facility 10/07/2013  . Elevated BP 10/07/2013  . Morbid obesity ([facility]) 10/07/2013    Loel Dubonnet, MS, OTR/L, [facility] 06/24/19 5:16 PM   [place] [facility] MAIN [facility] SERVICES [address] [place], [place], [address] Phone: [phone]   Fax:  [phone]  Name: Mary Montgomery MRN: [id number] Date of Birth: [birth year]

## 2019-06-27 ENCOUNTER — Ambulatory Visit: Payer: BC Managed Care – PPO | Admitting: Occupational Therapy

## 2019-06-27 ENCOUNTER — Other Ambulatory Visit: Payer: Self-pay

## 2019-06-27 DIAGNOSIS — I890 Lymphedema, not elsewhere classified: Secondary | ICD-10-CM

## 2019-06-27 NOTE — Therapy (Signed)
[place] [facility] MAIN [facility] SERVICES [address] [place], [place], [address] Phone: [phone]   Fax:  [phone]  Occupational Therapy Treatment  Patient Details  Name: Mary Montgomery MRN: [id number] Date of Birth: 1956-01-05 Referring Provider (OT): Neena Rhymes, MD   Encounter Date: 06/27/2019  OT End of Session - 06/27/19 1608    Visit Number  4    Number of Visits  36    Date for OT Re-Evaluation  09/08/19    OT Start Time  0300    OT Stop Time  0403    OT Time Calculation (min)  63 min    Activity Tolerance  Patient tolerated treatment well;No increased pain    Behavior During Therapy  WFL for tasks assessed/performed       Past Medical History:  Diagnosis Date  . Arthritis    right knee  . Bulging disc    Lumber 3/4  . Degenerated intervertebral disc    neck  . Hay fever   . Hyperlipemia   . Hypertension   . Plantar fasciitis    bilaterl feet    Past Surgical History:  Procedure Laterality Date  . CYSTECTOMY  1988   polynidal cyst  . WISDOM TOOTH EXTRACTION  1977    There were no vitals filed for this visit.  Subjective Assessment - 06/27/19 1511    Subjective   BLESS LISENBY presents for OT visit 4/36 to address BLE lymphedema. Pt reports her wraps got wet in the shower so she didn't put them back on.Pt denies leg pain.    Pertinent History  Dermatologist ID'd lipedema in ~ 11/2018; Recent 30 #weight less, +family hx of leg swelling-father,    Limitations  chronic leg swelling and pain, limit functional ambulation and transfers, hx plantar fasciitis, OA, decreased balance    Repetition  Increases Symptoms    Special Tests  negative Stemmer sign base of toes bilaterally    Patient Stated Goals  learn about lymphedema and find out what I can do with it to improve my health    Pain Onset  Other (comment)   childhood                  OT Treatments/Exercises (OP) - 06/27/19 0001      ADLs    ADL Education Given  Yes  (Pended)       Manual Therapy   Manual Therapy  Edema management;Manual Lymphatic Drainage (MLD);Compression Bandaging  (Pended)     Manual Lymphatic Drainage (MLD)  MLD to LLE using short neck sequence, deep abdominals, functional inguinals, and sequential strokes to the thigh, leg and foot. Good tolerance.  (Pended)     Compression Bandaging  multiple layer , knee length , gradient compression wraps to LLE using 8,10 and 12 cm short stretch wraps over 0.4 cm thick RosiIdal foam  (Pended)              OT Education - 06/27/19 1608    Education Details  Continued skilled Pt/caregiver education  And LE ADL training throughout visit for lymphedema self care/ home program, including compression wrapping, compression garment and device wear/care, lymphatic pumping ther ex, simple self-MLD, and skin care. Discussed progress towards goals.    Person(s) Educated  Patient    Methods  Explanation;Demonstration    Comprehension  Verbalized understanding;Returned demonstration;Need further instruction          OT Long Term Goals - 06/11/19 1514  OT LONG TERM GOAL #1   Title  Pt will be able to apply BLE, knee length, multi-layer, short stretch compression wraps daily to one leg at a time using correct gradient techniques with modified assistance (extra time)  to achieve optimal limb volume reduction, to return affected limb/s, as closely as possible, to premorbid size and shape, to limit infection risk, and to improve safe functional ambulation and mobility.    Baseline  dependent    Time  4    Period  Days    Status  New    Target Date  --   4th OT Rx visit     OT LONG TERM GOAL #2   Title  Pt will be able to verbalize signs and symptoms of cellulitis infection and identify 4 common lymphedema precautions using printed resource for reference to limit LE progression over time.    Baseline  Max A    Time  4    Period  Days    Status  New    Target Date  --    4th OT Rx visit     OT LONG TERM GOAL #3   Title  Pt will achieve and sustain no less than  85%  compliance with daily LE self-care home program (skin care, lymphatic pumping therex, compression and simple self-MLD) during Intensive Phase CDT to limit limb swelling, reduce infection risk and limit LE progression.    Baseline  Max A    Time  12    Period  Weeks    Status  New    Target Date  09/08/19      OT LONG TERM GOAL #4   Title  Pt to achieve at least 10% BLE limb volume reduction in BLE below the knees, during Intensive Phase CDT to improve safe functional mobility and ambulation, to improve functional performance of basic and instrumental ADLs, to limit infection risk and chronic LE progression.    Baseline  Max A    Time  12    Period  Weeks    Status  New    Target Date  09/08/19      OT LONG TERM GOAL #5   Title  Once issued Pt will be able to don and doff appropriate compression garments and/ or devices using correct techniques and assistive devices with modified independence and extra time for optimal LE management to limit progression over time.    Baseline  Max A    Time  12    Period  Weeks    Status  New    Target Date  09/08/19      Long Term Additional Goals   Additional Long Term Goals  Yes      OT LONG TERM GOAL #6   Title  During self-management phase of CDT Pt will retain limb volume reductions achieved during Intensive Phase CDT with no more than 3% volume increase to limit LE progression and further functional decline.    Baseline  Max A    Time  6    Period  Months    Status  New    Target Date  12/08/19            Plan - 06/27/19 1609    Clinical Impression Statement  Pt had mild difficulty tolerating ccompression wraps applied last session due to callouses on bunions being irritated. She removed wraps after several hours then reapplied in the morning using techniques taught in clinic. No visible change  in limb volume observed today. Pt  tolerated LLE MLD, skin care and reapplication of compression wraps today withouit difficulty. We omitted the foot in effort to limit discomfort and improve tolerance. Foot is minimally involved in keeping with typical lipo-lymphedema distribution. Cont as per OC.    OT Occupational Profile and History  Comprehensive Assessment- Review of records and extensive additional review of physical, cognitive, psychosocial history related to current functional performance    Occupational performance deficits (Please refer to evaluation for details):  ADL's;Work;IADL's;Leisure;Rest and Sleep;Social Participation;Other   body image, self esteme   Body Structure / Function / Physical Skills  ADL;ROM;Obesity;IADL;Edema;Balance;Decreased knowledge of precautions;Gait;Pain;Skin integrity;Mobility;Decreased knowledge of use of DME    Rehab Potential  Good    Clinical Decision Making  Several treatment options, min-mod task modification necessary    Comorbidities Affecting Occupational Performance:  Presence of comorbidities impacting occupational performance    Comorbidities impacting occupational performance description:  lipidema, suspected CVI    Modification or Assistance to Complete Evaluation   Min-Moderate modification of tasks or assist with assess necessary to complete eval    OT Frequency  2x / week    OT Duration  12 weeks    OT Treatment/Interventions  Self-care/ADL training;Therapeutic exercise;Functional Mobility Training;Manual lymph drainage;Therapeutic activities;Other (comment);Compression bandaging;Manual Therapy;DME and/or AE instruction   skin care with low ph [employer] (castor oil) and compression garment / device measurement and fitting   Plan  CDT: manual lymphatic drainage (MLD) . skin care, lymphatic pumping ther ex, compression with multilayer  , short stretch compression wraps-single leg at a time , knee length. Fit with FLAT KNIT, CUSTOM<, KNEE LENGTH, ccl3 (35-45 mmHg) JOBST ELVAREX  CLASSIC ELASTI COMPRESSION STOCKINgs for full time daily use. Consider fitting with [employer], Flexitouch, sequential pneumatic compression device, or "pump" for optimal LE self management over time at home. The 32-chamber Flexitouch is the only available sequential pneumatic compression device providing proximal to distal lymphatic fluid return via regional lymph nodes (LN) deep abdominal pathways and the thoracic duct to the heart. The basic pneumatic pump is not appropriate for this patient because it provides distal-to-proximal, retrograde massage mobilizing tissue fluid against back pressure which then abruptly ends before reaching regional LNs leaving dense, protein rich fluid at the joint.    Consulted and Agree with Plan of Care  Patient       Patient will benefit from skilled therapeutic intervention in order to improve the following deficits and impairments:   Body Structure / Function / Physical Skills: ADL, ROM, Obesity, IADL, Edema, Balance, Decreased knowledge of precautions, Gait, Pain, Skin integrity, Mobility, Decreased knowledge of use of DME       Visit Diagnosis: Lymphedema, not elsewhere classified    Problem List Patient Active Problem List   Diagnosis Date Noted  . Vitamin D deficiency 02/13/2019  . Hyperlipidemia 01/09/2014  . Routine general medical examination at a health care facility 10/07/2013  . Elevated BP 10/07/2013  . Morbid obesity (H[facility]) 10/07/2013    Loel Dubonnet, MS, OTR/L, [facility] 06/27/19 4:14 PM   [place] [facility] MAIN [facility] SERVICES [address] [place], [place], [address] Phone: [phone]   Fax:  [phone]  Name: MONTI VILLERS MRN: [id number] Date of Birth: 1955-07-24

## 2019-07-01 ENCOUNTER — Ambulatory Visit: Payer: BC Managed Care – PPO | Admitting: Occupational Therapy

## 2019-07-02 ENCOUNTER — Ambulatory Visit: Payer: BC Managed Care – PPO | Admitting: Occupational Therapy

## 2019-07-02 ENCOUNTER — Other Ambulatory Visit: Payer: Self-pay

## 2019-07-02 DIAGNOSIS — I890 Lymphedema, not elsewhere classified: Secondary | ICD-10-CM | POA: Diagnosis not present

## 2019-07-02 NOTE — Therapy (Signed)
[place] MAIN [facility] SERVICES [address] [place], [place], [address] Phone: [phone]   Fax:  [phone]  Occupational Therapy Treatment  Patient Details  Name: Mary Montgomery MRN: [id number] Date of Birth: Dec 10, 1955 Referring Provider (OT): Annye Asa, MD   Encounter Date: 07/02/2019  OT End of Session - 07/02/19 1505    Visit Number  5    Number of Visits  36    Date for OT Re-Evaluation  09/08/19    OT Start Time  0900    OT Stop Time  1000    OT Time Calculation (min)  60 min    Activity Tolerance  Patient tolerated treatment well;No increased pain    Behavior During Therapy  WFL for tasks assessed/performed       Past Medical History:  Diagnosis Date  . Arthritis    right knee  . Bulging disc    Lumber 3/4  . Degenerated intervertebral disc    neck  . Hay fever   . Hyperlipemia   . Hypertension   . Plantar fasciitis    bilaterl feet    Past Surgical History:  Procedure Laterality Date  . CYSTECTOMY  1988   polynidal cyst  . WISDOM TOOTH EXTRACTION  1977    There were no vitals filed for this visit.  Subjective Assessment - 07/02/19 6979    Subjective   Mary Montgomery presents for OT visit 5/36 to address BLE lymphedema. Pt reports compression wrapping is going pretty well except for wrapping too tight on Friday causing her L foot to swell. Pt denies leg pain.    Pertinent History  Dermatologist ID'd lipedema in ~ 11/2018; Recent 30 #weight less, +family hx of leg swelling-father,    Limitations  chronic leg swelling and pain, limit functional ambulation and transfers, hx plantar fasciitis, OA, decreased balance    Repetition  Increases Symptoms    Special Tests  negative Stemmer sign base of toes bilaterally    Patient Stated Goals  learn about lymphedema and find out what I can do with it to improve my health    Pain Onset  Other (comment)   childhood                  OT  Treatments/Exercises (OP) - 07/02/19 0001      ADLs   ADL Education Given  Yes      Manual Therapy   Manual Therapy  Edema management;Manual Lymphatic Drainage (MLD);Compression Bandaging    Manual Lymphatic Drainage (MLD)  MLD to LLE using short neck sequence, deep abdominals, functional inguinals, and sequential strokes to the thigh, leg and foot. Good tolerance.    Compression Bandaging  multiple layer , knee length , gradient compression wraps to LLE using 8,10 and 12 cm short stretch wraps over 0.4 cm thick Mary Montgomery foam             OT Education - 07/02/19 1242    Education Details  Continued skilled Pt/caregiver education  And LE ADL training throughout visit for lymphedema self care/ home program, including compression wrapping, compression garment and device wear/care, lymphatic pumping ther ex, simple self-MLD, and skin care. Discussed progress towards goals.    Person(s) Educated  Patient    Methods  Explanation;Demonstration    Comprehension  Verbalized understanding;Returned demonstration;Need further instruction          OT Long Term Goals - 07/02/19 1200      OT LONG TERM GOAL #  1   Title  Pt will be able to apply BLE, knee length, multi-layer, short stretch compression wraps daily to one leg at a time using correct gradient techniques with modified assistance (extra time)  to achieve optimal limb volume reduction, to return affected limb/s, as closely as possible, to premorbid size and shape, to limit infection risk, and to improve safe functional ambulation and mobility.    Baseline  dependent    Time  4    Period  Days    Status  Achieved    Target Date  --   achieved 07/02/19     OT LONG TERM GOAL #2   Title  Pt will be able to verbalize signs and symptoms of cellulitis infection and identify 4 common lymphedema precautions using printed resource for reference to limit LE progression over time.    Baseline  Max A    Time  4    Period  Days    Status  New       OT LONG TERM GOAL #3   Title  Pt will achieve and sustain no less than  85%  compliance with daily LE self-care home program (skin care, lymphatic pumping therex, compression and simple self-MLD) during Intensive Phase CDT to limit limb swelling, reduce infection risk and limit LE progression.    Baseline  Max A    Time  12    Period  Weeks    Status  New      OT LONG TERM GOAL #4   Title  Pt to achieve at least 10% BLE limb volume reduction in BLE below the knees, during Intensive Phase CDT to improve safe functional mobility and ambulation, to improve functional performance of basic and instrumental ADLs, to limit infection risk and chronic LE progression.    Baseline  Max A    Time  12    Period  Weeks    Status  New      OT LONG TERM GOAL #5   Title  Once issued Pt will be able to don and doff appropriate compression garments and/ or devices using correct techniques and assistive devices with modified independence and extra time for optimal LE management to limit progression over time.    Baseline  Max A    Time  12    Period  Weeks    Status  New      OT LONG TERM GOAL #6   Title  During self-management phase of CDT Pt will retain limb volume reductions achieved during Intensive Phase CDT with no more than 3% volume increase to limit LE progression and further functional decline.    Baseline  Max A    Time  6    Period  Months    Status  New            Plan - 07/02/19 1243    Clinical Impression Statement  Pt reports   no difficulty tolerating compression wraps to LLE during this Rx interval. Py corrected wraps sucessfully when she applied too tightly and caused foot swelling. Pt has mastered compression wraps. Goal met. Slight reduction in limb swelling observed   below the L knee  today at end of session. Progress towards goals is slow, as is typical with lipo-lymphedema, but steady. Cont as per POC.Good session.    OT Occupational Profile and History  Comprehensive  Assessment- Review of records and extensive additional review of physical, cognitive, psychosocial history related to current functional performance  Occupational performance deficits (Please refer to evaluation for details):  ADL's;Work;IADL's;Leisure;Rest and Sleep;Social Participation;Other   body image, self esteme   Body Structure / Function / Physical Skills  ADL;ROM;Obesity;IADL;Edema;Balance;Decreased knowledge of precautions;Gait;Pain;Skin integrity;Mobility;Decreased knowledge of use of DME    Rehab Potential  Good    Clinical Decision Making  Several treatment options, min-mod task modification necessary    Comorbidities Affecting Occupational Performance:  Presence of comorbidities impacting occupational performance    Comorbidities impacting occupational performance description:  lipidema, suspected CVI    Modification or Assistance to Complete Evaluation   Min-Moderate modification of tasks or assist with assess necessary to complete eval    OT Frequency  2x / week    OT Duration  12 weeks    OT Treatment/Interventions  Self-care/ADL training;Therapeutic exercise;Functional Mobility Training;Manual lymph drainage;Therapeutic activities;Other (comment);Compression bandaging;Manual Therapy;DME and/or AE instruction   skin care with low ph Raoul Pitch (castor oil) and compression garment / device measurement and fitting   Plan  CDT: manual lymphatic drainage (MLD) . skin care, lymphatic pumping ther ex, compression with multilayer  , short stretch compression wraps-single leg at a time , knee length. Fit with FLAT KNIT, CUSTOM<, KNEE LENGTH, ccl3 (35-45 mmHg) JOBST ELVAREX CLASSIC ELASTI COMPRESSION STOCKINgs for full time daily use. Consider fitting with [employer], Flexitouch, sequential pneumatic compression device, or "pump" for optimal LE self management over time at home. The 32-chamber Flexitouch is the only available sequential pneumatic compression device providing proximal  to distal lymphatic fluid return via regional lymph nodes (LN) deep abdominal pathways and the thoracic duct to the heart. The basic pneumatic pump is not appropriate for this patient because it provides distal-to-proximal, retrograde massage mobilizing tissue fluid against back pressure which then abruptly ends before reaching regional LNs leaving dense, protein rich fluid at the joint.    Consulted and Agree with Plan of Care  Patient       Patient will benefit from skilled therapeutic intervention in order to improve the following deficits and impairments:   Body Structure / Function / Physical Skills: ADL, ROM, Obesity, IADL, Edema, Balance, Decreased knowledge of precautions, Gait, Pain, Skin integrity, Mobility, Decreased knowledge of use of DME       Visit Diagnosis: Lymphedema, not elsewhere classified    Problem List Patient Active Problem List   Diagnosis Date Noted  . Vitamin D deficiency 02/13/2019  . Hyperlipidemia 01/09/2014  . Routine general medical examination at a health care facility 10/07/2013  . Elevated BP 10/07/2013  . Morbid obesity ([place]) 10/07/2013    Andrey Spearman, MS, OTR/L, [facility] 07/02/19 12:48 PM   [place] MAIN [facility] SERVICES [address] [place], [place], [address] Phone: [phone]   Fax:  [phone]  Name: AREEBAH MEINDERS MRN: [id number] Date of Birth: 1956-02-24

## 2019-07-04 ENCOUNTER — Ambulatory Visit: Payer: BC Managed Care – PPO | Attending: Family Medicine | Admitting: Occupational Therapy

## 2019-07-04 ENCOUNTER — Other Ambulatory Visit: Payer: Self-pay

## 2019-07-04 DIAGNOSIS — I890 Lymphedema, not elsewhere classified: Secondary | ICD-10-CM | POA: Diagnosis present

## 2019-07-04 NOTE — Therapy (Signed)
[place] MAIN [facility] SERVICES [address] [place], [place], [address] Phone: [phone]   Fax:  [phone]  Occupational Therapy Treatment  Patient Details  Name: Mary Montgomery MRN: [id number] Date of Birth: 1955/08/25 Referring Provider (OT): Annye Asa, MD   Encounter Date: 07/04/2019  OT End of Session - 07/04/19 1029    Visit Number  6    Number of Visits  36    Date for OT Re-Evaluation  09/08/19    OT Start Time  0904    OT Stop Time  1006    OT Time Calculation (min)  62 min    Activity Tolerance  Patient tolerated treatment well;No increased pain    Behavior During Therapy  WFL for tasks assessed/performed       Past Medical History:  Diagnosis Date  . Arthritis    right knee  . Bulging disc    Lumber 3/4  . Degenerated intervertebral disc    neck  . Hay fever   . Hyperlipemia   . Hypertension   . Plantar fasciitis    bilaterl feet    Past Surgical History:  Procedure Laterality Date  . CYSTECTOMY  1988   polynidal cyst  . WISDOM TOOTH EXTRACTION  1977    There were no vitals filed for this visit.  Subjective Assessment - 07/04/19 6962    Subjective   Mary Montgomery presents for OT visit 6/36 to address BLE lymphedema. Pt reports she feels like her compression wrapping technique has gotten much better.    Pertinent History  Dermatologist ID'd lipedema in ~ 11/2018; Recent 30 #weight less, +family hx of leg swelling-father,    Limitations  chronic leg swelling and pain, limit functional ambulation and transfers, hx plantar fasciitis, OA, decreased balance    Repetition  Increases Symptoms    Special Tests  negative Stemmer sign base of toes bilaterally    Patient Stated Goals  learn about lymphedema and find out what I can do with it to improve my health    Currently in Pain?  No/denies    Pain Location  Leg    Pain Orientation  Left;Right    Pain Onset  Other (comment)   childhood                   OT Treatments/Exercises (OP) - 07/04/19 0001      ADLs   ADL Education Given  Yes      Manual Therapy   Manual Therapy  Edema management;Manual Lymphatic Drainage (MLD);Compression Bandaging    Manual therapy comments  skin care using low ph castor oil throughoutt MLD    Manual Lymphatic Drainage (MLD)  MLD to LLE using short neck sequence, deep abdominals, functional inguinals, and sequential strokes to the thigh, leg and foot. Good tolerance.    Compression Bandaging  multiple layer , knee length , gradient compression wraps to LLE using 8,10 and 12 cm short stretch wraps over 0.4 cm thick RosiIdal foam             OT Education - 07/04/19 1029    Education Details  Continued skilled Pt/caregiver education  And LE ADL training throughout visit for lymphedema self care/ home program, including compression wrapping, compression garment and device wear/care, lymphatic pumping ther ex, simple self-MLD, and skin care. Discussed progress towards goals.    Person(s) Educated  Patient    Methods  Explanation;Demonstration    Comprehension  Verbalized understanding;Returned demonstration;Need further  instruction          OT Long Term Goals - 07/02/19 1200      OT LONG TERM GOAL #1   Title  Pt will be able to apply BLE, knee length, multi-layer, short stretch compression wraps daily to one leg at a time using correct gradient techniques with modified assistance (extra time)  to achieve optimal limb volume reduction, to return affected limb/s, as closely as possible, to premorbid size and shape, to limit infection risk, and to improve safe functional ambulation and mobility.    Baseline  dependent    Time  4    Period  Days    Status  Achieved    Target Date  --   achieved 07/02/19     OT LONG TERM GOAL #2   Title  Pt will be able to verbalize signs and symptoms of cellulitis infection and identify 4 common lymphedema precautions using printed resource for  reference to limit LE progression over time.    Baseline  Max A    Time  4    Period  Days    Status  New      OT LONG TERM GOAL #3   Title  Pt will achieve and sustain no less than  85%  compliance with daily LE self-care home program (skin care, lymphatic pumping therex, compression and simple self-MLD) during Intensive Phase CDT to limit limb swelling, reduce infection risk and limit LE progression.    Baseline  Max A    Time  12    Period  Weeks    Status  New      OT LONG TERM GOAL #4   Title  Pt to achieve at least 10% BLE limb volume reduction in BLE below the knees, during Intensive Phase CDT to improve safe functional mobility and ambulation, to improve functional performance of basic and instrumental ADLs, to limit infection risk and chronic LE progression.    Baseline  Max A    Time  12    Period  Weeks    Status  New      OT LONG TERM GOAL #5   Title  Once issued Pt will be able to don and doff appropriate compression garments and/ or devices using correct techniques and assistive devices with modified independence and extra time for optimal LE management to limit progression over time.    Baseline  Max A    Time  12    Period  Weeks    Status  New      OT LONG TERM GOAL #6   Title  During self-management phase of CDT Pt will retain limb volume reductions achieved during Intensive Phase CDT with no more than 3% volume increase to limit LE progression and further functional decline.    Baseline  Max A    Time  6    Period  Months    Status  New            Plan - 07/04/19 1030    Clinical Impression Statement  Pt wrapping skills improving with practice and review in clinic. Tolerance for wraps has improved to 23/7 during visit intervals. Pt tolerated LLE MLD, skin care and gradient compression wraooing without increased pain today. Limb volume is slighty decreased from initial assessment, but reduction is proceeding slowly as is typical for lipolymphedema. Cont as  per OC.    OT Occupational Profile and History  Comprehensive Assessment- Review of records and extensive  additional review of physical, cognitive, psychosocial history related to current functional performance    Occupational performance deficits (Please refer to evaluation for details):  ADL's;Work;IADL's;Leisure;Rest and Sleep;Social Participation;Other   body image, self esteme   Body Structure / Function / Physical Skills  ADL;ROM;Obesity;IADL;Edema;Balance;Decreased knowledge of precautions;Gait;Pain;Skin integrity;Mobility;Decreased knowledge of use of DME    Rehab Potential  Good    Clinical Decision Making  Several treatment options, min-mod task modification necessary    Comorbidities Affecting Occupational Performance:  Presence of comorbidities impacting occupational performance    Comorbidities impacting occupational performance description:  lipidema, suspected CVI    Modification or Assistance to Complete Evaluation   Min-Moderate modification of tasks or assist with assess necessary to complete eval    OT Frequency  2x / week    OT Duration  12 weeks    OT Treatment/Interventions  Self-care/ADL training;Therapeutic exercise;Functional Mobility Training;Manual lymph drainage;Therapeutic activities;Other (comment);Compression bandaging;Manual Therapy;DME and/or AE instruction   skin care with low ph Reeves Forth (castor oil) and compression garment / device measurement and fitting   Plan  CDT: manual lymphatic drainage (MLD) . skin care, lymphatic pumping ther ex, compression with multilayer  , short stretch compression wraps-single leg at a time , knee length. Fit with FLAT KNIT, CUSTOM<, KNEE LENGTH, ccl3 (35-45 mmHg) JOBST ELVAREX CLASSIC ELASTI COMPRESSION STOCKINgs for full time daily use. Consider fitting with [employer], Flexitouch, sequential pneumatic compression device, or "pump" for optimal LE self management over time at home. The 32-chamber Flexitouch is the only  available sequential pneumatic compression device providing proximal to distal lymphatic fluid return via regional lymph nodes (LN) deep abdominal pathways and the thoracic duct to the heart. The basic pneumatic pump is not appropriate for this patient because it provides distal-to-proximal, retrograde massage mobilizing tissue fluid against back pressure which then abruptly ends before reaching regional LNs leaving dense, protein rich fluid at the joint.    Consulted and Agree with Plan of Care  Patient       Patient will benefit from skilled therapeutic intervention in order to improve the following deficits and impairments:   Body Structure / Function / Physical Skills: ADL, ROM, Obesity, IADL, Edema, Balance, Decreased knowledge of precautions, Gait, Pain, Skin integrity, Mobility, Decreased knowledge of use of DME       Visit Diagnosis: Lymphedema, not elsewhere classified    Problem List Patient Active Problem List   Diagnosis Date Noted  . Vitamin D deficiency 02/13/2019  . Hyperlipidemia 01/09/2014  . Routine general medical examination at a health care facility 10/07/2013  . Elevated BP 10/07/2013  . Morbid obesity (HCC) 10/07/2013   Loel Dubonnet, MS, OTR/L, [facility] 07/04/19 10:34 AM   [facility] MAIN [facility] SERVICES [address] [place], [place], [address] Phone: [phone]   Fax:  [phone]  Name: Mary Montgomery MRN: [id number] Date of Birth: 1955/05/30

## 2019-07-08 ENCOUNTER — Other Ambulatory Visit: Payer: Self-pay

## 2019-07-08 ENCOUNTER — Ambulatory Visit: Payer: BC Managed Care – PPO | Admitting: Occupational Therapy

## 2019-07-08 DIAGNOSIS — I890 Lymphedema, not elsewhere classified: Secondary | ICD-10-CM

## 2019-07-08 NOTE — Therapy (Signed)
[facility] MAIN [facility] SERVICES [address] [place], [place], [address] Phone: [phone]   Fax:  [phone]  Occupational Therapy Treatment  Patient Details  Name: Mary Montgomery MRN: [id number] Date of Birth: 1956/03/24 Referring Provider (OT): Neena Rhymes, MD   Encounter Date: 07/08/2019  OT End of Session - 07/08/19 1608    Visit Number  7    Number of Visits  36    Date for OT Re-Evaluation  09/08/19    OT Start Time  0305    OT Stop Time  0403    OT Time Calculation (min)  58 min    Activity Tolerance  Patient tolerated treatment well;No increased pain    Behavior During Therapy  WFL for tasks assessed/performed       Past Medical History:  Diagnosis Date  . Arthritis    right knee  . Bulging disc    Lumber 3/4  . Degenerated intervertebral disc    neck  . Hay fever   . Hyperlipemia   . Hypertension   . Plantar fasciitis    bilaterl feet    Past Surgical History:  Procedure Laterality Date  . CYSTECTOMY  1988   polynidal cyst  . WISDOM TOOTH EXTRACTION  1977    There were no vitals filed for this visit.  Subjective Assessment - 07/08/19 1514    Subjective   SELISA TENSLEY presents for OT visit 7/36 to address BLE lymphedema. Pt has no new complaints today. She feels she has mastered compression wraping using techniques learned in clinic.  Pt and OT discuss goals, progress towards goals and LE precautions throughout session.                   OT Treatments/Exercises (OP) - 07/08/19 0001      ADLs   ADL Education Given  Yes      Manual Therapy   Manual Therapy  Edema management;Manual Lymphatic Drainage (MLD);Compression Bandaging    Manual therapy comments  skin care using low ph castor oil throughoutt MLD    Manual Lymphatic Drainage (MLD)  MLD to LLE using short neck sequence, deep abdominals, functional inguinals, and sequential strokes to the thigh, leg and foot. Good tolerance.     Compression Bandaging  multiple layer , knee length , gradient compression wraps to LLE using 8,10 and 12 cm short stretch wraps over 0.4 cm thick RosiIdal foam             OT Education - 07/08/19 1606    Education Details  Skilled Pt edu for LE precautions and signs/ symptoms of cellulitis. Pt edu for optimal ther ex for lymphatic pumping- specifically aquatic exercise. Pt edu for vbasic   vs advanced sequential pneumatic compression devices , or "pump". Flexitouch info provided    Person(s) Educated  Patient    Methods  Explanation;Demonstration;Handout;Verbal cues    Comprehension  Verbalized understanding;Returned demonstration;Need further instruction;Verbal cues required          OT Long Term Goals - 07/02/19 1200      OT LONG TERM GOAL #1   Title  Pt will be able to apply BLE, knee length, multi-layer, short stretch compression wraps daily to one leg at a time using correct gradient techniques with modified assistance (extra time)  to achieve optimal limb volume reduction, to return affected limb/s, as closely as possible, to premorbid size and shape, to limit infection risk, and to improve safe functional ambulation and  mobility.    Baseline  dependent    Time  4    Period  Days    Status  Achieved    Target Date  --   achieved 07/02/19     OT LONG TERM GOAL #2   Title  Pt will be able to verbalize signs and symptoms of cellulitis infection and identify 4 common lymphedema precautions using printed resource for reference to limit LE progression over time.    Baseline  Max A    Time  4    Period  Days    Status  New      OT LONG TERM GOAL #3   Title  Pt will achieve and sustain no less than  85%  compliance with daily LE self-care home program (skin care, lymphatic pumping therex, compression and simple self-MLD) during Intensive Phase CDT to limit limb swelling, reduce infection risk and limit LE progression.    Baseline  Max A    Time  12    Period  Weeks    Status   New      OT LONG TERM GOAL #4   Title  Pt to achieve at least 10% BLE limb volume reduction in BLE below the knees, during Intensive Phase CDT to improve safe functional mobility and ambulation, to improve functional performance of basic and instrumental ADLs, to limit infection risk and chronic LE progression.    Baseline  Max A    Time  12    Period  Weeks    Status  New      OT LONG TERM GOAL #5   Title  Once issued Pt will be able to don and doff appropriate compression garments and/ or devices using correct techniques and assistive devices with modified independence and extra time for optimal LE management to limit progression over time.    Baseline  Max A    Time  12    Period  Weeks    Status  New      OT LONG TERM GOAL #6   Title  During self-management phase of CDT Pt will retain limb volume reductions achieved during Intensive Phase CDT with no more than 3% volume increase to limit LE progression and further functional decline.    Baseline  Max A    Time  6    Period  Months    Status  New            Plan - 07/08/19 1608    Clinical Impression Statement  Pt tolerated MLD and compression reapplication. Spent session reviewing progress towards goals, dsecription of Intensive Phase CDT and transition to self-management phase of LE self care. Discussed garments briefly. Pt demonstrates slow but steady progress towards all LE goals. Cont as per POC. Consider trial with Flexi in coming weeks and complete comparativve limb volume measurements for progress note upcoming  on visit 10.    OT Occupational Profile and History  Comprehensive Assessment- Review of records and extensive additional review of physical, cognitive, psychosocial history related to current functional performance    Occupational performance deficits (Please refer to evaluation for details):  ADL's;Work;IADL's;Leisure;Rest and Sleep;Social Participation;Other   body image, self esteme   Body Structure /  Function / Physical Skills  ADL;ROM;Obesity;IADL;Edema;Balance;Decreased knowledge of precautions;Gait;Pain;Skin integrity;Mobility;Decreased knowledge of use of DME    Rehab Potential  Good    Clinical Decision Making  Several treatment options, min-mod task modification necessary    Comorbidities Affecting Occupational Performance:  Presence of comorbidities impacting occupational performance    Comorbidities impacting occupational performance description:  lipidema, suspected CVI    Modification or Assistance to Complete Evaluation   Min-Moderate modification of tasks or assist with assess necessary to complete eval    OT Frequency  2x / week    OT Duration  12 weeks    OT Treatment/Interventions  Self-care/ADL training;Therapeutic exercise;Functional Mobility Training;Manual lymph drainage;Therapeutic activities;Other (comment);Compression bandaging;Manual Therapy;DME and/or AE instruction   skin care with low ph Raoul Pitch (castor oil) and compression garment / device measurement and fitting   Plan  CDT: manual lymphatic drainage (MLD) . skin care, lymphatic pumping ther ex, compression with multilayer  , short stretch compression wraps-single leg at a time , knee length. Fit with FLAT KNIT, CUSTOM<, KNEE LENGTH, ccl3 (35-45 mmHg) JOBST ELVAREX CLASSIC ELASTI COMPRESSION STOCKINgs for full time daily use. Consider fitting with [employer], Flexitouch, sequential pneumatic compression device, or "pump" for optimal LE self management over time at home. The 32-chamber Flexitouch is the only available sequential pneumatic compression device providing proximal to distal lymphatic fluid return via regional lymph nodes (LN) deep abdominal pathways and the thoracic duct to the heart. The basic pneumatic pump is not appropriate for this patient because it provides distal-to-proximal, retrograde massage mobilizing tissue fluid against back pressure which then abruptly ends before reaching regional LNs  leaving dense, protein rich fluid at the joint.    Consulted and Agree with Plan of Care  Patient       Patient will benefit from skilled therapeutic intervention in order to improve the following deficits and impairments:   Body Structure / Function / Physical Skills: ADL, ROM, Obesity, IADL, Edema, Balance, Decreased knowledge of precautions, Gait, Pain, Skin integrity, Mobility, Decreased knowledge of use of DME       Visit Diagnosis: Lymphedema, not elsewhere classified    Problem List Patient Active Problem List   Diagnosis Date Noted  . Vitamin D deficiency 02/13/2019  . Hyperlipidemia 01/09/2014  . Routine general medical examination at a health care facility 10/07/2013  . Elevated BP 10/07/2013  . Morbid obesity ([place]) 10/07/2013    Andrey Spearman, MS, OTR/L, [facility] 07/08/19 4:13 PM   [place] MAIN [facility] SERVICES [address] [place], [place], [address] Phone: [phone]   Fax:  [phone]  Name: KELBY LOTSPEICH MRN: [id number] Date of Birth: 23-Nov-1955

## 2019-07-11 ENCOUNTER — Ambulatory Visit: Payer: BC Managed Care – PPO | Admitting: Occupational Therapy

## 2019-07-11 ENCOUNTER — Encounter: Payer: Self-pay | Admitting: Occupational Therapy

## 2019-07-11 ENCOUNTER — Other Ambulatory Visit: Payer: Self-pay

## 2019-07-11 DIAGNOSIS — I890 Lymphedema, not elsewhere classified: Secondary | ICD-10-CM | POA: Diagnosis not present

## 2019-07-11 NOTE — Therapy (Signed)
[place] MAIN [facility] SERVICES [address] [place], [place], [address] Phone: [phone]   Fax:  [phone]  Occupational Therapy Treatment  Patient Details  Name: Mary Montgomery MRN: [id number] Date of Birth: Oct 06, 1955 Referring Provider (OT): Annye Asa, MD   Encounter Date: 07/11/2019  OT End of Session - 07/11/19 1616    Visit Number  8    Number of Visits  36    Date for OT Re-Evaluation  09/08/19    OT Start Time  0300    OT Stop Time  0409    OT Time Calculation (min)  69 min    Activity Tolerance  Patient tolerated treatment well;No increased pain    Behavior During Therapy  WFL for tasks assessed/performed       Past Medical History:  Diagnosis Date  . Arthritis    right knee  . Bulging disc    Lumber 3/4  . Degenerated intervertebral disc    neck  . Hay fever   . Hyperlipemia   . Hypertension   . Plantar fasciitis    bilaterl feet    Past Surgical History:  Procedure Laterality Date  . CYSTECTOMY  1988   polynidal cyst  . WISDOM TOOTH EXTRACTION  1977    There were no vitals filed for this visit.  Subjective Assessment - 07/11/19 1442    Subjective   Mary Montgomery presents for OT visit 8/36 to address BLE lymphedema. Pt agreeable to comparative volumetrics towards end of visit to measure progress towards volumetric reduction goal for LLE.          LYMPHEDEMA/ONCOLOGY QUESTIONNAIRE - 07/11/19 1613      Left Lower Extremity Lymphedema   Other  LLE A-D limb volume = 6402.7 ml.    Other  LLE limb volume below the knee is decreased  by 9.56% since initial measurements on 06/17/19. Goal  Met!              OT Treatments/Exercises (OP) - 07/11/19 0001      ADLs   ADL Education Given  Yes      Manual Therapy   Manual Therapy  Edema management;Manual Lymphatic Drainage (MLD);Compression Bandaging    Manual therapy comments  skin care using low ph castor oil throughoutt MLD    Edema  Management  LLE comparative limb volumetrics    Manual Lymphatic Drainage (MLD)  MLD to LLE using short neck sequence, deep abdominals, functional inguinals, and sequential strokes to the thigh, leg and foot. Good tolerance.    Compression Bandaging  multiple layer , knee length , gradient compression wraps to LLE using 8,10 and 12 cm short stretch wraps over 0.4 cm thick RosiIdal foam             OT Education - 07/11/19 1615    Education Details  Pt edu re differece between advanced and basic sequentialpneumatic compression devices and rational for recommendation for advanced "pump" in her case. Pt edu re volumetric changes measured today and progress towards goal.    Person(s) Educated  Patient    Methods  Explanation;Demonstration    Comprehension  Verbalized understanding;Returned demonstration;Need further instruction          OT Long Term Goals - 07/11/19 1600      OT LONG TERM GOAL #1   Title  Pt will be able to apply BLE, knee length, multi-layer, short stretch compression wraps daily to one leg at a time using correct gradient  techniques with modified assistance (extra time)  to achieve optimal limb volume reduction, to return affected limb/s, as closely as possible, to premorbid size and shape, to limit infection risk, and to improve safe functional ambulation and mobility.    Baseline  dependent    Time  4    Period  Days    Status  Achieved      OT LONG TERM GOAL #2   Title  Pt will be able to verbalize signs and symptoms of cellulitis infection and identify 4 common lymphedema precautions using printed resource for reference to limit LE progression over time.    Baseline  Max A    Time  4    Period  Days    Status  New      OT LONG TERM GOAL #3   Title  Pt will achieve and sustain no less than  85%  compliance with daily LE self-care home program (skin care, lymphatic pumping therex, compression and simple self-MLD) during Intensive Phase CDT to limit limb  swelling, reduce infection risk and limit LE progression.    Baseline  Max A    Time  12    Period  Weeks    Status  Partially Met      OT LONG TERM GOAL #4   Title  Pt to achieve at least 10% BLE limb volume reduction in BLE below the knees, during Intensive Phase CDT to improve safe functional mobility and ambulation, to improve functional performance of basic and instrumental ADLs, to limit infection risk and chronic LE progression.    Baseline  Max A    Time  12    Period  Weeks    Status  Partially Met    Target Date  --   met 07/11/19 for LLE     OT LONG TERM GOAL #5   Title  Once issued Pt will be able to don and doff appropriate compression garments and/ or devices using correct techniques and assistive devices with modified independence and extra time for optimal LE management to limit progression over time.    Baseline  Max A    Time  12    Period  Weeks    Status  On-going      OT LONG TERM GOAL #6   Title  During self-management phase of CDT Pt will retain limb volume reductions achieved during Intensive Phase CDT with no more than 3% volume increase to limit LE progression and further functional decline.    Baseline  Max A    Time  6    Period  Months    Status  On-going            Plan - 07/11/19 1617    Clinical Impression Statement  Completed LLE comparative limb volumetrics this date in prep for progress report. LLE limb volume below the knee is decreased  by 9.56% since initial measurements on 06/17/19. This value meets 10% volume reduction goa from A-D landmarks. Pt reports some increased soreness along pretibial region, which is typical   after undergoing limb volume reduction that was providing padding to structures unused to increased interstitial pressure from wraps. I expect this will resolve in a few visits. Pt cut herself shaving her legs in several places distally         before visit and has bandaids alonge "shelf" at ankles. Pt instructed to keep  impecably clean and to monitor for signs of infection. Pt demonstrates good progress today.  Cont as per POC>    OT Occupational Profile and History  Comprehensive Assessment- Review of records and extensive additional review of physical, cognitive, psychosocial history related to current functional performance    Occupational performance deficits (Please refer to evaluation for details):  ADL's;Work;IADL's;Leisure;Rest and Sleep;Social Participation;Other   body image, self esteme   Body Structure / Function / Physical Skills  ADL;ROM;Obesity;IADL;Edema;Balance;Decreased knowledge of precautions;Gait;Pain;Skin integrity;Mobility;Decreased knowledge of use of DME    Rehab Potential  Good    Clinical Decision Making  Several treatment options, min-mod task modification necessary    Comorbidities Affecting Occupational Performance:  Presence of comorbidities impacting occupational performance    Comorbidities impacting occupational performance description:  lipidema, suspected CVI    Modification or Assistance to Complete Evaluation   Min-Moderate modification of tasks or assist with assess necessary to complete eval    OT Frequency  2x / week    OT Duration  12 weeks    OT Treatment/Interventions  Self-care/ADL training;Therapeutic exercise;Functional Mobility Training;Manual lymph drainage;Therapeutic activities;Other (comment);Compression bandaging;Manual Therapy;DME and/or AE instruction   skin care with low ph [place] (castor oil) and compression garment / device measurement and fitting   Plan  CDT: manual lymphatic drainage (MLD) . skin care, lymphatic pumping ther ex, compression with multilayer  , short stretch compression wraps-single leg at a time , knee length. Fit with FLAT KNIT, CUSTOM<, KNEE LENGTH, ccl3 (35-45 mmHg) JOBST ELVAREX CLASSIC ELASTI COMPRESSION STOCKINgs for full time daily use. Consider fitting with [employer], Flexitouch, sequential pneumatic compression device, or  "pump" for optimal LE self management over time at home. The 32-chamber Flexitouch is the only available sequential pneumatic compression device providing proximal to distal lymphatic fluid return via regional lymph nodes (LN) deep abdominal pathways and the thoracic duct to the heart. The basic pneumatic pump is not appropriate for this patient because it provides distal-to-proximal, retrograde massage mobilizing tissue fluid against back pressure which then abruptly ends before reaching regional LNs leaving dense, protein rich fluid at the joint.    Consulted and Agree with Plan of Care  Patient       Patient will benefit from skilled therapeutic intervention in order to improve the following deficits and impairments:   Body Structure / Function / Physical Skills: ADL, ROM, Obesity, IADL, Edema, Balance, Decreased knowledge of precautions, Gait, Pain, Skin integrity, Mobility, Decreased knowledge of use of DME       Visit Diagnosis: Lymphedema, not elsewhere classified    Problem List Patient Active Problem List   Diagnosis Date Noted  . Vitamin D deficiency 02/13/2019  . Hyperlipidemia 01/09/2014  . Routine general medical examination at a health care facility 10/07/2013  . Elevated BP 10/07/2013  . Morbid obesity ([place]) 10/07/2013    Andrey Spearman, MS, OTR/L, [facility] 07/11/19 4:24 PM  [place] MAIN [facility] SERVICES [address] [place], [place], [address] Phone: [phone]   Fax:  [phone]  Name: RANYA FIDDLER MRN: [id number] Date of Birth: 02/10/1956

## 2019-07-15 ENCOUNTER — Other Ambulatory Visit: Payer: Self-pay

## 2019-07-15 ENCOUNTER — Ambulatory Visit: Payer: BC Managed Care – PPO | Admitting: Occupational Therapy

## 2019-07-15 DIAGNOSIS — I890 Lymphedema, not elsewhere classified: Secondary | ICD-10-CM

## 2019-07-15 NOTE — Therapy (Signed)
[place] MAIN [facility] SERVICES [address] [place], [place], [address] Phone: [phone]   Fax:  [phone]  Occupational Therapy Treatment  Patient Details  Name: Mary Montgomery MRN: [id number] Date of Birth: 12-06-1955 Referring Provider (OT): Annye Asa, MD   Encounter Date: 07/15/2019  OT End of Session - 07/15/19 1510    Visit Number  9    Number of Visits  36    Date for OT Re-Evaluation  09/08/19    OT Start Time  0305    OT Stop Time  0401    OT Time Calculation (min)  56 min    Activity Tolerance  Patient tolerated treatment well;No increased pain    Behavior During Therapy  WFL for tasks assessed/performed       Past Medical History:  Diagnosis Date  . Arthritis    right knee  . Bulging disc    Lumber 3/4  . Degenerated intervertebral disc    neck  . Hay fever   . Hyperlipemia   . Hypertension   . Plantar fasciitis    bilaterl feet    Past Surgical History:  Procedure Laterality Date  . CYSTECTOMY  1988   polynidal cyst  . WISDOM TOOTH EXTRACTION  1977    There were no vitals filed for this visit.  Subjective Assessment - 07/15/19 1608    Subjective   Mary Montgomery presents for OT visit 9/36 to address BLE lymphedema. Pt has no new complaints thsi date.                   OT Treatments/Exercises (OP) - 07/15/19 0001      ADLs   ADL Education Given  Yes      Manual Therapy   Manual Therapy  Edema management    Manual therapy comments  skin care using low ph castor oil throughoutt MLD    Edema Management  LLE comparative limb volumetrics    Manual Lymphatic Drainage (MLD)  MLD to LLE using short neck sequence, deep abdominals, functional inguinals, and sequential strokes to the thigh, leg and foot. Good tolerance.    Compression Bandaging  multiple layer , knee length , gradient compression wraps to LLE using 8,10 and 12 cm short stretch wraps over 0.4 cm thick RosiIdal foam              OT Education - 07/15/19 1510    Education Details  Continued skilled Pt/caregiver education  And LE ADL training throughout visit for lymphedema self care/ home program, including compression wrapping, compression garment and device wear/care, lymphatic pumping ther ex, simple self-MLD, and skin care. Discussed progress towards goals.    Person(s) Educated  Patient    Methods  Explanation;Demonstration    Comprehension  Verbalized understanding;Returned demonstration;Need further instruction          OT Long Term Goals - 07/11/19 1600      OT LONG TERM GOAL #1   Title  Pt will be able to apply BLE, knee length, multi-layer, short stretch compression wraps daily to one leg at a time using correct gradient techniques with modified assistance (extra time)  to achieve optimal limb volume reduction, to return affected limb/s, as closely as possible, to premorbid size and shape, to limit infection risk, and to improve safe functional ambulation and mobility.    Baseline  dependent    Time  4    Period  Days    Status  Achieved  OT LONG TERM GOAL #2   Title  Pt will be able to verbalize signs and symptoms of cellulitis infection and identify 4 common lymphedema precautions using printed resource for reference to limit LE progression over time.    Baseline  Max A    Time  4    Period  Days    Status  New      OT LONG TERM GOAL #3   Title  Pt will achieve and sustain no less than  85%  compliance with daily LE self-care home program (skin care, lymphatic pumping therex, compression and simple self-MLD) during Intensive Phase CDT to limit limb swelling, reduce infection risk and limit LE progression.    Baseline  Max A    Time  12    Period  Weeks    Status  Partially Met      OT LONG TERM GOAL #4   Title  Pt to achieve at least 10% BLE limb volume reduction in BLE below the knees, during Intensive Phase CDT to improve safe functional mobility and ambulation, to improve  functional performance of basic and instrumental ADLs, to limit infection risk and chronic LE progression.    Baseline  Max A    Time  12    Period  Weeks    Status  Partially Met    Target Date  --   met 07/11/19 for LLE     OT LONG TERM GOAL #5   Title  Once issued Pt will be able to don and doff appropriate compression garments and/ or devices using correct techniques and assistive devices with modified independence and extra time for optimal LE management to limit progression over time.    Baseline  Max A    Time  12    Period  Weeks    Status  On-going      OT LONG TERM GOAL #6   Title  During self-management phase of CDT Pt will retain limb volume reductions achieved during Intensive Phase CDT with no more than 3% volume increase to limit LE progression and further functional decline.    Baseline  Max A    Time  6    Period  Months    Status  On-going            Plan - 07/15/19 1611    Clinical Impression Statement  Emphasis of visit on MLD to LLE/LLQ as established, and application of knee length, multilayer, short stretch compression wraps. Pt  continues to perform all LE self care protocols daily with excellent compliance. Cont as per POC.    OT Occupational Profile and History  Comprehensive Assessment- Review of records and extensive additional review of physical, cognitive, psychosocial history related to current functional performance    Occupational performance deficits (Please refer to evaluation for details):  ADL's;Work;IADL's;Leisure;Rest and Sleep;Social Participation;Other   body image, self esteme   Body Structure / Function / Physical Skills  ADL;ROM;Obesity;IADL;Edema;Balance;Decreased knowledge of precautions;Gait;Pain;Skin integrity;Mobility;Decreased knowledge of use of DME    Rehab Potential  Good    Clinical Decision Making  Several treatment options, min-mod task modification necessary    Comorbidities Affecting Occupational Performance:  Presence of  comorbidities impacting occupational performance    Comorbidities impacting occupational performance description:  lipidema, suspected CVI    Modification or Assistance to Complete Evaluation   Min-Moderate modification of tasks or assist with assess necessary to complete eval    OT Frequency  2x / week  OT Duration  12 weeks    OT Treatment/Interventions  Self-care/ADL training;Therapeutic exercise;Functional Mobility Training;Manual lymph drainage;Therapeutic activities;Other (comment);Compression bandaging;Manual Therapy;DME and/or AE instruction   skin care with low ph [place] (castor oil) and compression garment / device measurement and fitting   Plan  CDT: manual lymphatic drainage (MLD) . skin care, lymphatic pumping ther ex, compression with multilayer  , short stretch compression wraps-single leg at a time , knee length. Fit with FLAT KNIT, CUSTOM<, KNEE LENGTH, ccl3 (35-45 mmHg) JOBST ELVAREX CLASSIC ELASTI COMPRESSION STOCKINgs for full time daily use. Consider fitting with [employer], Flexitouch, sequential pneumatic compression device, or "pump" for optimal LE self management over time at home. The 32-chamber Flexitouch is the only available sequential pneumatic compression device providing proximal to distal lymphatic fluid return via regional lymph nodes (LN) deep abdominal pathways and the thoracic duct to the heart. The basic pneumatic pump is not appropriate for this patient because it provides distal-to-proximal, retrograde massage mobilizing tissue fluid against back pressure which then abruptly ends before reaching regional LNs leaving dense, protein rich fluid at the joint.    Consulted and Agree with Plan of Care  Patient       Patient will benefit from skilled therapeutic intervention in order to improve the following deficits and impairments:   Body Structure / Function / Physical Skills: ADL, ROM, Obesity, IADL, Edema, Balance, Decreased knowledge of precautions,  Gait, Pain, Skin integrity, Mobility, Decreased knowledge of use of DME       Visit Diagnosis: Lymphedema, not elsewhere classified    Problem List Patient Active Problem List   Diagnosis Date Noted  . Vitamin D deficiency 02/13/2019  . Hyperlipidemia 01/09/2014  . Routine general medical examination at a health care facility 10/07/2013  . Elevated BP 10/07/2013  . Morbid obesity ([facility]) 10/07/2013    Theresa Gilliam, MS, OTR/L, CLT-[facility]NA 07/15/19 4:13 PM  [facility] SERVICES [address] , [place], [address] Phone: [phone]   Fax:  [phone]  Name: Mary Montgomery MRN: [id number] Date of Birth: 06/23/1955 

## 2019-07-18 ENCOUNTER — Ambulatory Visit: Payer: BC Managed Care – PPO | Admitting: Occupational Therapy

## 2019-07-18 ENCOUNTER — Other Ambulatory Visit: Payer: Self-pay

## 2019-07-18 DIAGNOSIS — I890 Lymphedema, not elsewhere classified: Secondary | ICD-10-CM | POA: Diagnosis not present

## 2019-07-18 NOTE — Therapy (Signed)
[place] MAIN [facility] SERVICES [address] [place], [place], [address] Phone: [phone]   Fax:  [phone]  Occupational Therapy Treatment Note and Progress Report: Lymphedema Care  Patient Details  Name: Mary Montgomery MRN: [id number] Date of Birth: 03-05-1956 Referring Provider (OT): Annye Asa, MD   Encounter Date: 07/18/2019  OT End of Session - 07/18/19 1459    Visit Number  10    Number of Visits  36    Date for OT Re-Evaluation  09/08/19    OT Start Time  0300    OT Stop Time  0400    OT Time Calculation (min)  60 min    Activity Tolerance  Patient tolerated treatment well;No increased pain    Behavior During Therapy  WFL for tasks assessed/performed       Past Medical History:  Diagnosis Date  . Arthritis    right knee  . Bulging disc    Lumber 3/4  . Degenerated intervertebral disc    neck  . Hay fever   . Hyperlipemia   . Hypertension   . Plantar fasciitis    bilaterl feet    Past Surgical History:  Procedure Laterality Date  . CYSTECTOMY  1988   polynidal cyst  . WISDOM TOOTH EXTRACTION  1977    There were no vitals filed for this visit.  Subjective Assessment - 07/18/19 1455    Subjective   Mary Montgomery presents for OT visit 10/36 to address BLE lymphedema. Pt c/o "tiredness" in the legs, about the same" bilterally. "The shins feel sore..... By the end of the week my legs just feel sore."          LYMPHEDEMA/ONCOLOGY QUESTIONNAIRE - 07/18/19 1607      Left Lower Extremity Lymphedema   Other  LLE A-D limb volume = 6648.37 ml ml.    Other  LLE limb volume below the knee is INcreased  by 3.8% since last measured on 4/8. Total limb volume decrease with todays mild increase measures  6.089% in LLE.              OT Treatments/Exercises (OP) - 07/18/19 0001      ADLs   ADL Education Given  Yes      Manual Therapy   Manual Therapy  Edema management    Manual therapy comments   skin care using low ph castor oil throughoutt MLD    Edema Management  LLE comparative limb volumetrics    Manual Lymphatic Drainage (MLD)  MLD to LLE using short neck sequence, deep abdominals, functional inguinals, and sequential strokes to the thigh, leg and foot. Good tolerance.    Compression Bandaging  multiple layer , knee length , gradient compression wraps to LLE using 8,10 and 12 cm short stretch wraps over 0.4 cm thick RosiIdal foam             OT Education - 07/18/19 1458    Education Details  Continued skilled Pt/caregiver education  And LE ADL training throughout visit for lymphedema self care/ home program, including compression wrapping, compression garment and device wear/care, lymphatic pumping ther ex, simple self-MLD, and skin care. Discussed progress towards goals.    Person(s) Educated  Patient    Methods  Explanation;Demonstration    Comprehension  Verbalized understanding;Returned demonstration;Need further instruction          OT Long Term Goals - 07/18/19 1400      OT LONG TERM GOAL #1  Title  Pt will be able to apply BLE, knee length, multi-layer, short stretch compression wraps daily to one leg at a time using correct gradient techniques with modified assistance (extra time)  to achieve optimal limb volume reduction, to return affected limb/s, as closely as possible, to premorbid size and shape, to limit infection risk, and to improve safe functional ambulation and mobility.    Baseline  dependent    Time  4    Period  Days    Status  Achieved      OT LONG TERM GOAL #2   Title  Pt will be able to verbalize signs and symptoms of cellulitis infection and identify 4 common lymphedema precautions using printed resource for reference to limit LE progression over time.    Baseline  Max A    Time  4    Period  Days    Status  Achieved      OT LONG TERM GOAL #3   Title  Pt will achieve and sustain no less than  85%  compliance with daily LE self-care home  program (skin care, lymphatic pumping therex, compression and simple self-MLD) during Intensive Phase CDT to limit limb swelling, reduce infection risk and limit LE progression.    Baseline  Max A    Time  12    Period  Weeks    Status  Achieved      OT LONG TERM GOAL #4   Title  Pt to achieve at least 10% BLE limb volume reduction in BLE below the knees, during Intensive Phase CDT to improve safe functional mobility and ambulation, to improve functional performance of basic and instrumental ADLs, to limit infection risk and chronic LE progression.    Baseline  Max A    Time  12    Period  Weeks    Status  Partially Met      OT LONG TERM GOAL #5   Title  Once issued Pt will be able to don and doff appropriate compression garments and/ or devices using correct techniques and assistive devices with modified independence and extra time for optimal LE management to limit progression over time.    Baseline  Max A    Time  12    Period  Weeks    Status  On-going      OT LONG TERM GOAL #6   Title  During self-management phase of CDT Pt will retain limb volume reductions achieved during Intensive Phase CDT with no more than 3% volume increase to limit LE progression and further functional decline.    Baseline  Max A    Time  6    Period  Months    Status  On-going            Plan - 07/18/19 1611    Clinical Impression Statement  LLE limb volume below the knee is INcreased  by 3.8% since last measured on 4/8. Total limb volume decrease with todays mild increase measures  6.089% in LLE. Todays volumetric change is most likely systemic fluid retention. Although we did not measure bilaterally. Pt reports she feels "swollen all over" which suggests systemic etiology of a small fluctuation. Pt continues to demonstrate slow but steady progress towards all OT goals. See LONG TERM GOALS for detailed description. Cont as per OC.    OT Occupational Profile and History  Comprehensive Assessment-  Review of records and extensive additional review of physical, cognitive, psychosocial history related to current functional performance  Occupational performance deficits (Please refer to evaluation for details):  ADL's;Work;IADL's;Leisure;Rest and Sleep;Social Participation;Other   body image, self esteme   Body Structure / Function / Physical Skills  ADL;ROM;Obesity;IADL;Edema;Balance;Decreased knowledge of precautions;Gait;Pain;Skin integrity;Mobility;Decreased knowledge of use of DME    Rehab Potential  Good    Clinical Decision Making  Several treatment options, min-mod task modification necessary    Comorbidities Affecting Occupational Performance:  Presence of comorbidities impacting occupational performance    Comorbidities impacting occupational performance description:  lipidema, suspected CVI    Modification or Assistance to Complete Evaluation   Min-Moderate modification of tasks or assist with assess necessary to complete eval    OT Frequency  2x / week    OT Duration  12 weeks    OT Treatment/Interventions  Self-care/ADL training;Therapeutic exercise;Functional Mobility Training;Manual lymph drainage;Therapeutic activities;Other (comment);Compression bandaging;Manual Therapy;DME and/or AE instruction   skin care with low ph Raoul Pitch (castor oil) and compression garment / device measurement and fitting   Plan  CDT: manual lymphatic drainage (MLD) . skin care, lymphatic pumping ther ex, compression with multilayer  , short stretch compression wraps-single leg at a time , knee length. Fit with FLAT KNIT, CUSTOM<, KNEE LENGTH, ccl3 (35-45 mmHg) JOBST ELVAREX CLASSIC ELASTI COMPRESSION STOCKINgs for full time daily use. Consider fitting with [employer], Flexitouch, sequential pneumatic compression device, or "pump" for optimal LE self management over time at home. The 32-chamber Flexitouch is the only available sequential pneumatic compression device providing proximal to distal  lymphatic fluid return via regional lymph nodes (LN) deep abdominal pathways and the thoracic duct to the heart. The basic pneumatic pump is not appropriate for this patient because it provides distal-to-proximal, retrograde massage mobilizing tissue fluid against back pressure which then abruptly ends before reaching regional LNs leaving dense, protein rich fluid at the joint.    Consulted and Agree with Plan of Care  Patient       Patient will benefit from skilled therapeutic intervention in order to improve the following deficits and impairments:   Body Structure / Function / Physical Skills: ADL, ROM, Obesity, IADL, Edema, Balance, Decreased knowledge of precautions, Gait, Pain, Skin integrity, Mobility, Decreased knowledge of use of DME       Visit Diagnosis: Lymphedema, not elsewhere classified    Problem List Patient Active Problem List   Diagnosis Date Noted  . Vitamin D deficiency 02/13/2019  . Hyperlipidemia 01/09/2014  . Routine general medical examination at a health care facility 10/07/2013  . Elevated BP 10/07/2013  . Morbid obesity ([place]) 10/07/2013    Andrey Spearman, MS, OTR/L, [facility] 07/18/19 4:14 PM  [place] MAIN [facility] SERVICES [address] [place], [place], [address] Phone: [phone]   Fax:  [phone]  Name: Mary Montgomery MRN: [id number] Date of Birth: [birth year]

## 2019-07-22 ENCOUNTER — Ambulatory Visit: Payer: BC Managed Care – PPO | Admitting: Occupational Therapy

## 2019-07-22 DIAGNOSIS — I890 Lymphedema, not elsewhere classified: Secondary | ICD-10-CM | POA: Diagnosis not present

## 2019-07-23 NOTE — Therapy (Signed)
[place] MAIN [facility] SERVICES [address] [place], [place], [address] Phone: [phone]   Fax:  [phone]  Occupational Therapy Treatment  Patient Details  Name: Mary Montgomery MRN: [id number] Date of Birth: 1955/09/17 Referring Provider (OT): Annye Asa, MD   Encounter Date: 07/22/2019  OT End of Session - 07/22/19 1034    Visit Number  11    Number of Visits  36    Date for OT Re-Evaluation  09/08/19    OT Start Time  0315    OT Stop Time  0405    OT Time Calculation (min)  50 min    Activity Tolerance  Patient tolerated treatment well;No increased pain    Behavior During Therapy  WFL for tasks assessed/performed       Past Medical History:  Diagnosis Date  . Arthritis    right knee  . Bulging disc    Lumber 3/4  . Degenerated intervertebral disc    neck  . Hay fever   . Hyperlipemia   . Hypertension   . Plantar fasciitis    bilaterl feet    Past Surgical History:  Procedure Laterality Date  . CYSTECTOMY  1988   polynidal cyst  . WISDOM TOOTH EXTRACTION  1977    There were no vitals filed for this visit.  Subjective Assessment - 07/22/19 1032    Subjective   Mary Montgomery presents for OT visit 11/36 to address BLE lymphedema. Pt missed her 8 am scheduled appointment this morning, but was able to attend the 3 PM appointment when Pt no showed. Pt has no new complaints today. Leg pain is unchanged.                   OT Treatments/Exercises (OP) - 07/23/19 0001      ADLs   ADL Education Given  Yes      Manual Therapy   Manual Therapy  Edema management;Manual Lymphatic Drainage (MLD);Compression Bandaging    Manual therapy comments  skin care using low ph castor oil throughoutt MLD    Manual Lymphatic Drainage (MLD)  MLD to LLE using short neck sequence, deep abdominals, functional inguinals, and sequential strokes to the thigh, leg and foot. Good tolerance.    Compression Bandaging   multiple layer , knee length , gradient compression wraps to LLE using 8,10 and 12 cm short stretch wraps over 0.4 cm thick RosiIdal foam             OT Education - 07/23/19 1034    Education Details  Continued skilled Pt/caregiver education  And LE ADL training throughout visit for lymphedema self care/ home program, including compression wrapping, compression garment and device wear/care, lymphatic pumping ther ex, simple self-MLD, and skin care. Discussed progress towards goals.    Person(s) Educated  Patient    Methods  Explanation;Demonstration    Comprehension  Verbalized understanding;Returned demonstration;Need further instruction          OT Long Term Goals - 07/18/19 1400      OT LONG TERM GOAL #1   Title  Pt will be able to apply BLE, knee length, multi-layer, short stretch compression wraps daily to one leg at a time using correct gradient techniques with modified assistance (extra time)  to achieve optimal limb volume reduction, to return affected limb/s, as closely as possible, to premorbid size and shape, to limit infection risk, and to improve safe functional ambulation and mobility.    Baseline  dependent    Time  4    Period  Days    Status  Achieved      OT LONG TERM GOAL #2   Title  Pt will be able to verbalize signs and symptoms of cellulitis infection and identify 4 common lymphedema precautions using printed resource for reference to limit LE progression over time.    Baseline  Max A    Time  4    Period  Days    Status  Achieved      OT LONG TERM GOAL #3   Title  Pt will achieve and sustain no less than  85%  compliance with daily LE self-care home program (skin care, lymphatic pumping therex, compression and simple self-MLD) during Intensive Phase CDT to limit limb swelling, reduce infection risk and limit LE progression.    Baseline  Max A    Time  12    Period  Weeks    Status  Achieved      OT LONG TERM GOAL #4   Title  Pt to achieve at least  10% BLE limb volume reduction in BLE below the knees, during Intensive Phase CDT to improve safe functional mobility and ambulation, to improve functional performance of basic and instrumental ADLs, to limit infection risk and chronic LE progression.    Baseline  Max A    Time  12    Period  Weeks    Status  Partially Met      OT LONG TERM GOAL #5   Title  Once issued Pt will be able to don and doff appropriate compression garments and/ or devices using correct techniques and assistive devices with modified independence and extra time for optimal LE management to limit progression over time.    Baseline  Max A    Time  12    Period  Weeks    Status  On-going      OT LONG TERM GOAL #6   Title  During self-management phase of CDT Pt will retain limb volume reductions achieved during Intensive Phase CDT with no more than 3% volume increase to limit LE progression and further functional decline.    Baseline  Max A    Time  6    Period  Months    Status  On-going            Plan - 07/22/19 1035    Clinical Impression Statement  Pt tolerated manual therapy and compression wraps to LLE without increased pain or other difficulty today. Limb volume reduction is slow, as is typical with lipo-lymphedema, despite excellent compliance with all LE self-care home program components between visits. Cont as per OC. Pt not yet ready for garment measurements. +    OT Occupational Profile and History  Comprehensive Assessment- Review of records and extensive additional review of physical, cognitive, psychosocial history related to current functional performance    Occupational performance deficits (Please refer to evaluation for details):  ADL's;Work;IADL's;Leisure;Rest and Sleep;Social Participation;Other   body image, self esteme   Body Structure / Function / Physical Skills  ADL;ROM;Obesity;IADL;Edema;Balance;Decreased knowledge of precautions;Gait;Pain;Skin integrity;Mobility;Decreased knowledge of  use of DME    Rehab Potential  Good    Clinical Decision Making  Several treatment options, min-mod task modification necessary    Comorbidities Affecting Occupational Performance:  Presence of comorbidities impacting occupational performance    Comorbidities impacting occupational performance description:  lipidema, suspected CVI    Modification or Assistance to Complete Evaluation  Min-Moderate modification of tasks or assist with assess necessary to complete eval    OT Frequency  2x / week    OT Duration  12 weeks    OT Treatment/Interventions  Self-care/ADL training;Therapeutic exercise;Functional Mobility Training;Manual lymph drainage;Therapeutic activities;Other (comment);Compression bandaging;Manual Therapy;DME and/or AE instruction   skin care with low ph Raoul Pitch (castor oil) and compression garment / device measurement and fitting   Plan  CDT: manual lymphatic drainage (MLD) . skin care, lymphatic pumping ther ex, compression with multilayer  , short stretch compression wraps-single leg at a time , knee length. Fit with FLAT KNIT, CUSTOM<, KNEE LENGTH, ccl3 (35-45 mmHg) JOBST ELVAREX CLASSIC ELASTI COMPRESSION STOCKINgs for full time daily use. Consider fitting with [employer], Flexitouch, sequential pneumatic compression device, or "pump" for optimal LE self management over time at home. The 32-chamber Flexitouch is the only available sequential pneumatic compression device providing proximal to distal lymphatic fluid return via regional lymph nodes (LN) deep abdominal pathways and the thoracic duct to the heart. The basic pneumatic pump is not appropriate for this patient because it provides distal-to-proximal, retrograde massage mobilizing tissue fluid against back pressure which then abruptly ends before reaching regional LNs leaving dense, protein rich fluid at the joint.    Consulted and Agree with Plan of Care  Patient       Patient will benefit from skilled therapeutic  intervention in order to improve the following deficits and impairments:   Body Structure / Function / Physical Skills: ADL, ROM, Obesity, IADL, Edema, Balance, Decreased knowledge of precautions, Gait, Pain, Skin integrity, Mobility, Decreased knowledge of use of DME       Visit Diagnosis: Lymphedema, not elsewhere classified    Problem List Patient Active Problem List   Diagnosis Date Noted  . Vitamin D deficiency 02/13/2019  . Hyperlipidemia 01/09/2014  . Routine general medical examination at a health care facility 10/07/2013  . Elevated BP 10/07/2013  . Morbid obesity ([place]) 10/07/2013    Andrey Spearman, MS, OTR/L, [facility] 07/23/19 10:38 AM  [place] MAIN [facility] SERVICES [address] [place], [place], [address] Phone: [phone]   Fax:  [phone]  Name: Mary Montgomery MRN: [id number] Date of Birth: 1955/10/11

## 2019-07-25 ENCOUNTER — Ambulatory Visit: Payer: BC Managed Care – PPO | Admitting: Occupational Therapy

## 2019-07-25 ENCOUNTER — Other Ambulatory Visit: Payer: Self-pay

## 2019-07-25 DIAGNOSIS — I890 Lymphedema, not elsewhere classified: Secondary | ICD-10-CM

## 2019-07-25 NOTE — Therapy (Signed)
[name] MA[facility] REHAB SERVICES [address] [place], , [address] Phone: [phone]   Fax:  [phone]  Occupational Therapy Treatment  Patient Details  Name: Mary Montgomery MRN: [id number] Date of Birth: 08/14/1955 Referring Provider (OT): Katherine Tabori, MD   Encounter Date: 07/25/2019  OT End of Session - 07/25/19 1608    Visit Number  12    Number of Visits  36    Date for OT Re-Evaluation  09/08/19    OT Start Time  0300    OT Stop Time  0405    OT Time Calculation (min)  65 min    Activity Tolerance  Patient tolerated treatment well;No increased pain    Behavior During Therapy  WFL for tasks assessed/performed       Past Medical History:  Diagnosis Date  . Arthritis    right knee  . Bulging disc    Lumber 3/4  . Degenerated intervertebral disc    neck  . Hay fever   . Hyperlipemia   . Hypertension   . Plantar fasciitis    bilaterl feet    Past Surgical History:  Procedure Laterality Date  . CYSTECTOMY  1988   polynidal cyst  . WISDOM TOOTH EXTRACTION  1977    There were no vitals filed for this visit.                OT Treatments/Exercises (OP) - 07/25/19 0001      ADLs   ADL Education Given  Yes      Manual Therapy   Manual Therapy  Edema management;Manual Lymphatic Drainage (MLD);Compression Bandaging    Manual therapy comments  skin care using low ph castor oil throughoutt MLD    Manual Lymphatic Drainage (MLD)  MLD to LLE using short neck sequence, deep abdominals, functional inguinals, and sequential strokes to the thigh, leg and foot. Good tolerance.    Compression Bandaging  multiple layer , knee length , gradient compression wraps to LLE using 8,10 and 12 cm short stretch wraps over 0.4 cm thick RosiIdal foam             OT Education - 07/25/19 1608    Education Details  Continued skilled Pt/caregiver education  And LE ADL training throughout visit for lymphedema self  care/ home program, including compression wrapping, compression garment and device wear/care, lymphatic pumping ther ex, simple self-MLD, and skin care. Discussed progress towards goals.    Person(s) Educated  Patient    Methods  Explanation;Demonstration    Comprehension  Verbalized understanding;Returned demonstration          OT Long Term Goals - 07/18/19 1400      OT LONG TERM GOAL #1   Title  Pt will be able to apply BLE, knee length, multi-layer, short stretch compression wraps daily to one leg at a time using correct gradient techniques with modified assistance (extra time)  to achieve optimal limb volume reduction, to return affected limb/s, as closely as possible, to premorbid size and shape, to limit infection risk, and to improve safe functional ambulation and mobility.    Baseline  dependent    Time  4    Period  Days    Status  Achieved      OT LONG TERM GOAL #2   Title  Pt will be able to verbalize signs and symptoms of cellulitis infection and identify 4 common lymphedema precautions using printed resource for reference to limit LE progression over   time.    Baseline  Max A    Time  4    Period  Days    Status  Achieved      OT LONG TERM GOAL #3   Title  Pt will achieve and sustain no less than  85%  compliance with daily LE self-care home program (skin care, lymphatic pumping therex, compression and simple self-MLD) during Intensive Phase CDT to limit limb swelling, reduce infection risk and limit LE progression.    Baseline  Max A    Time  12    Period  Weeks    Status  Achieved      OT LONG TERM GOAL #4   Title  Pt to achieve at least 10% BLE limb volume reduction in BLE below the knees, during Intensive Phase CDT to improve safe functional mobility and ambulation, to improve functional performance of basic and instrumental ADLs, to limit infection risk and chronic LE progression.    Baseline  Max A    Time  12    Period  Weeks    Status  Partially Met      OT  LONG TERM GOAL #5   Title  Once issued Pt will be able to don and doff appropriate compression garments and/ or devices using correct techniques and assistive devices with modified independence and extra time for optimal LE management to limit progression over time.    Baseline  Max A    Time  12    Period  Weeks    Status  On-going      OT LONG TERM GOAL #6   Title  During self-management phase of CDT Pt will retain limb volume reductions achieved during Intensive Phase CDT with no more than 3% volume increase to limit LE progression and further functional decline.    Baseline  Max A    Time  6    Period  Months    Status  On-going            Plan - 07/25/19 1609    Clinical Impression Statement  Emphasis of visit on MLD to LLE/LLQ as established, and application of knee length, multilayer, short stretch compression wraps. Pt  continues to perform all LE self care protocols daily with excellent compliance. Cont as per POC.    OT Occupational Profile and History  Comprehensive Assessment- Review of records and extensive additional review of physical, cognitive, psychosocial history related to current functional performance    Occupational performance deficits (Please refer to evaluation for details):  ADL's;Work;IADL's;Leisure;Rest and Sleep;Social Participation;Other   body image, self esteme   Body Structure / Function / Physical Skills  ADL;ROM;Obesity;IADL;Edema;Balance;Decreased knowledge of precautions;Gait;Pain;Skin integrity;Mobility;Decreased knowledge of use of DME    Rehab Potential  Good    Clinical Decision Making  Several treatment options, min-mod task modification necessary    Comorbidities Affecting Occupational Performance:  Presence of comorbidities impacting occupational performance    Comorbidities impacting occupational performance description:  lipidema, suspected CVI    Modification or Assistance to Complete Evaluation   Min-Moderate modification of tasks or  assist with assess necessary to complete eval    OT Frequency  2x / week    OT Duration  12 weeks    OT Treatment/Interventions  Self-care/ADL training;Therapeutic exercise;Functional Mobility Training;Manual lymph drainage;Therapeutic activities;Other (comment);Compression bandaging;Manual Therapy;DME and/or AE instruction   skin care with low ph Raoul Pitch (castor oil) and compression garment / device measurement and fitting   Plan  CDT: manual  lymphatic drainage (MLD) . skin care, lymphatic pumping ther ex, compression with multilayer  , short stretch compression wraps-single leg at a time , knee length. Fit with FLAT KNIT, CUSTOM<, KNEE LENGTH, ccl3 (35-45 mmHg) JOBST ELVAREX CLASSIC ELASTI COMPRESSION STOCKINgs for full time daily use. Consider fitting with [employer], Flexitouch, sequential pneumatic compression device, or "pump" for optimal LE self management over time at home. The 32-chamber Flexitouch is the only available sequential pneumatic compression device providing proximal to distal lymphatic fluid return via regional lymph nodes (LN) deep abdominal pathways and the thoracic duct to the heart. The basic pneumatic pump is not appropriate for this patient because it provides distal-to-proximal, retrograde massage mobilizing tissue fluid against back pressure which then abruptly ends before reaching regional LNs leaving dense, protein rich fluid at the joint.    Consulted and Agree with Plan of Care  Patient       Patient will benefit from skilled therapeutic intervention in order to improve the following deficits and impairments:   Body Structure / Function / Physical Skills: ADL, ROM, Obesity, IADL, Edema, Balance, Decreased knowledge of precautions, Gait, Pain, Skin integrity, Mobility, Decreased knowledge of use of DME       Visit Diagnosis: Lymphedema, not elsewhere classified    Problem List Patient Active Problem List   Diagnosis Date Noted  . Vitamin D deficiency  02/13/2019  . Hyperlipidemia 01/09/2014  . Routine general medical examination at a health care facility 10/07/2013  . Elevated BP 10/07/2013  . Morbid obesity ([place]) 10/07/2013    Andrey Spearman, MS, OTR/L, [facility] 07/25/19 4:10 PM  [place] MAIN [facility] SERVICES [address] [place], [place], [address] Phone: [phone]   Fax:  [phone]  Name: ANJULIE DIPIERRO MRN: [id number] Date of Birth: 07/27/1955

## 2019-07-29 ENCOUNTER — Ambulatory Visit: Payer: BC Managed Care – PPO | Admitting: Occupational Therapy

## 2019-07-29 ENCOUNTER — Other Ambulatory Visit: Payer: Self-pay

## 2019-07-29 DIAGNOSIS — I890 Lymphedema, not elsewhere classified: Secondary | ICD-10-CM

## 2019-07-29 NOTE — Therapy (Signed)
[place] MAIN [facility] SERVICES [address] [place], [place], [address] Phone: [phone]   Fax:  [phone]  Occupational Therapy Treatment  Patient Details  Name: Mary Montgomery MRN: [id number] Date of Birth: 09-25-1955 Referring Provider (OT): Annye Asa, MD   Encounter Date: 07/29/2019  OT End of Session - 07/29/19 1702    Visit Number  13    Number of Visits  36    Date for OT Re-Evaluation  09/08/19    OT Start Time  0311    OT Stop Time  0411    OT Time Calculation (min)  60 min    Activity Tolerance  Patient tolerated treatment well;No increased pain    Behavior During Therapy  WFL for tasks assessed/performed       Past Medical History:  Diagnosis Date  . Arthritis    right knee  . Bulging disc    Lumber 3/4  . Degenerated intervertebral disc    neck  . Hay fever   . Hyperlipemia   . Hypertension   . Plantar fasciitis    bilaterl feet    Past Surgical History:  Procedure Laterality Date  . CYSTECTOMY  1988   polynidal cyst  . WISDOM TOOTH EXTRACTION  1977    There were no vitals filed for this visit.  Subjective Assessment - 07/29/19 1512    Subjective   Mary Montgomery presents for OT visit 12/36 to address BLE lymphedema. Pt denies leg pain this afternoon.                   OT Treatments/Exercises (OP) - 07/29/19 0001      ADLs   ADL Education Given  Yes      Manual Therapy   Manual Therapy  Edema management;Manual Lymphatic Drainage (MLD);Compression Bandaging    Manual therapy comments  skin care using low ph castor oil throughoutt MLD    Manual Lymphatic Drainage (MLD)  MLD to LLE using short neck sequence, deep abdominals, functional inguinals, and sequential strokes to the thigh, leg and foot. Good tolerance.    Compression Bandaging  multiple layer , knee length , gradient compression wraps to LLE using 8,10 and 12 cm short stretch wraps over 0.4 cm thick RosiIdal foam              OT Education - 07/29/19 1700    Education Details  Discussed POC , transition to RLE ( untreated thus farI and process fro fitting custom compression garments .Discussed schedulng recommendations.    Person(s) Educated  Patient    Methods  Explanation;Demonstration    Comprehension  Verbalized understanding;Returned demonstration          OT Long Term Goals - 07/18/19 1400      OT LONG TERM GOAL #1   Title  Pt will be able to apply BLE, knee length, multi-layer, short stretch compression wraps daily to one leg at a time using correct gradient techniques with modified assistance (extra time)  to achieve optimal limb volume reduction, to return affected limb/s, as closely as possible, to premorbid size and shape, to limit infection risk, and to improve safe functional ambulation and mobility.    Baseline  dependent    Time  4    Period  Days    Status  Achieved      OT LONG TERM GOAL #2   Title  Pt will be able to verbalize signs and symptoms of cellulitis infection and  identify 4 common lymphedema precautions using printed resource for reference to limit LE progression over time.    Baseline  Max A    Time  4    Period  Days    Status  Achieved      OT LONG TERM GOAL #3   Title  Pt will achieve and sustain no less than  85%  compliance with daily LE self-care home program (skin care, lymphatic pumping therex, compression and simple self-MLD) during Intensive Phase CDT to limit limb swelling, reduce infection risk and limit LE progression.    Baseline  Max A    Time  12    Period  Weeks    Status  Achieved      OT LONG TERM GOAL #4   Title  Pt to achieve at least 10% BLE limb volume reduction in BLE below the knees, during Intensive Phase CDT to improve safe functional mobility and ambulation, to improve functional performance of basic and instrumental ADLs, to limit infection risk and chronic LE progression.    Baseline  Max A    Time  12    Period  Weeks     Status  Partially Met      OT LONG TERM GOAL #5   Title  Once issued Pt will be able to don and doff appropriate compression garments and/ or devices using correct techniques and assistive devices with modified independence and extra time for optimal LE management to limit progression over time.    Baseline  Max A    Time  12    Period  Weeks    Status  On-going      OT LONG TERM GOAL #6   Title  During self-management phase of CDT Pt will retain limb volume reductions achieved during Intensive Phase CDT with no more than 3% volume increase to limit LE progression and further functional decline.    Baseline  Max A    Time  6    Period  Months    Status  On-going            Plan - 07/29/19 1704    Clinical Impression Statement  Pt tolerated manual therapy and compression wraps to LLE without increased pain. Plan is to complete volumetric measurements   on LLE at last visit this week. If volume remains at last level , or fluctuates down only slightly we'll consider completing custom garment measurements the following week. We'll continue Intensive Phase CDT to LLE while awaiting delivery yo complete fitting, then transition to RLE Intensive CDT and LLE management Phase.    OT Occupational Profile and History  Comprehensive Assessment- Review of records and extensive additional review of physical, cognitive, psychosocial history related to current functional performance    Occupational performance deficits (Please refer to evaluation for details):  ADL's;Work;IADL's;Leisure;Rest and Sleep;Social Participation;Other   body image, self esteme   Body Structure / Function / Physical Skills  ADL;ROM;Obesity;IADL;Edema;Balance;Decreased knowledge of precautions;Gait;Pain;Skin integrity;Mobility;Decreased knowledge of use of DME    Rehab Potential  Good    Clinical Decision Making  Several treatment options, min-mod task modification necessary    Comorbidities Affecting Occupational Performance:   Presence of comorbidities impacting occupational performance    Comorbidities impacting occupational performance description:  lipidema, suspected CVI    Modification or Assistance to Complete Evaluation   Min-Moderate modification of tasks or assist with assess necessary to complete eval    OT Frequency  2x / week    OT  Duration  12 weeks    OT Treatment/Interventions  Self-care/ADL training;Therapeutic exercise;Functional Mobility Training;Manual lymph drainage;Therapeutic activities;Other (comment);Compression bandaging;Manual Therapy;DME and/or AE instruction   skin care with low ph Raoul Pitch (castor oil) and compression garment / device measurement and fitting   Plan  CDT: manual lymphatic drainage (MLD) . skin care, lymphatic pumping ther ex, compression with multilayer  , short stretch compression wraps-single leg at a time , knee length. Fit with FLAT KNIT, CUSTOM<, KNEE LENGTH, ccl3 (35-45 mmHg) JOBST ELVAREX CLASSIC ELASTI COMPRESSION STOCKINgs for full time daily use. Consider fitting with [employer], Flexitouch, sequential pneumatic compression device, or "pump" for optimal LE self management over time at home. The 32-chamber Flexitouch is the only available sequential pneumatic compression device providing proximal to distal lymphatic fluid return via regional lymph nodes (LN) deep abdominal pathways and the thoracic duct to the heart. The basic pneumatic pump is not appropriate for this patient because it provides distal-to-proximal, retrograde massage mobilizing tissue fluid against back pressure which then abruptly ends before reaching regional LNs leaving dense, protein rich fluid at the joint.    Consulted and Agree with Plan of Care  Patient       Patient will benefit from skilled therapeutic intervention in order to improve the following deficits and impairments:   Body Structure / Function / Physical Skills: ADL, ROM, Obesity, IADL, Edema, Balance, Decreased knowledge of  precautions, Gait, Pain, Skin integrity, Mobility, Decreased knowledge of use of DME       Visit Diagnosis: Lymphedema, not elsewhere classified    Problem List Patient Active Problem List   Diagnosis Date Noted  . Vitamin D deficiency 02/13/2019  . Hyperlipidemia 01/09/2014  . Routine general medical examination at a health care facility 10/07/2013  . Elevated BP 10/07/2013  . Morbid obesity ([place]) 10/07/2013    Andrey Spearman, MS, OTR/L, [facility] 07/29/19 5:06 PM  [place] MAIN [facility] SERVICES [address] [place], [place], [address] Phone: [phone]   Fax:  [phone]  Name: Mary Montgomery MRN: [id number] Date of Birth: 05/19/1955

## 2019-08-01 ENCOUNTER — Ambulatory Visit: Payer: BC Managed Care – PPO | Admitting: Occupational Therapy

## 2019-08-05 ENCOUNTER — Ambulatory Visit: Payer: BC Managed Care – PPO | Attending: Family Medicine | Admitting: Occupational Therapy

## 2019-08-05 ENCOUNTER — Other Ambulatory Visit: Payer: Self-pay

## 2019-08-05 DIAGNOSIS — I890 Lymphedema, not elsewhere classified: Secondary | ICD-10-CM | POA: Insufficient documentation

## 2019-08-06 NOTE — Therapy (Signed)
[place] MAIN [facility] SERVICES [address] [place], [place], [address] Phone: [phone]   Fax:  [phone]  Occupational Therapy Treatment  Patient Details  Name: Mary Montgomery MRN: [id number] Date of Birth: 1956-01-24 Referring Provider (OT): Annye Asa, MD   Encounter Date: 08/05/2019  OT End of Session - 08/06/19 1117    Visit Number  14    Number of Visits  36    Date for OT Re-Evaluation  09/08/19    OT Start Time  0315    OT Stop Time  0430    OT Time Calculation (min)  75 min    Activity Tolerance  Patient tolerated treatment well;No increased pain    Behavior During Therapy  WFL for tasks assessed/performed       Past Medical History:  Diagnosis Date  . Arthritis    right knee  . Bulging disc    Lumber 3/4  . Degenerated intervertebral disc    neck  . Hay fever   . Hyperlipemia   . Hypertension   . Plantar fasciitis    bilaterl feet    Past Surgical History:  Procedure Laterality Date  . CYSTECTOMY  1988   polynidal cyst  . WISDOM TOOTH EXTRACTION  1977    There were no vitals filed for this visit.  Subjective Assessment - 08/06/19 1111    Subjective   MATTISON GOLAY presents for OT visit 13/36 to address BLE lymphedema. Pt caught the toe of her shoe on carpeting in classroom last week and fell forward onto L knee and leg. Pt missed last appointment due to soreness. Pt denies leg pain this afternoon.    Pertinent History  Dermatologist ID'd lipedema in ~ 11/2018; Recent 30 #weight less, +family hx of leg swelling-father,    Limitations  chronic leg swelling and pain, limit functional ambulation and transfers, hx plantar fasciitis, OA, decreased balance    Repetition  Increases Symptoms    Special Tests  negative Stemmer sign base of toes bilaterally    Patient Stated Goals  learn about lymphedema and find out what I can do with it to improve my health          LYMPHEDEMA/ONCOLOGY QUESTIONNAIRE  - 08/06/19 1113      Left Lower Extremity Lymphedema   Other  LLE A-D limb volume = 6648.37 ml ml.    Other  LLE A-D  limb volume is INcreased  by 3.8% since last measured on 4/8. Total LLE limb volume decrease to date measures  6.089% in LLE. GOAL ongoing.                       OT Education - 08/06/19 1116    Education Details  Pt edu for custom garment measurement and fitting process. Pt edu for using a remote DME vendor since our local DME group no longer accepts state plan [employer]    Person(s) Educated  Patient    Methods  Explanation;Demonstration    Comprehension  Verbalized understanding;Returned demonstration;Need further instruction          OT Long Term Goals - 07/18/19 1400      OT LONG TERM GOAL #1   Title  Pt will be able to apply BLE, knee length, multi-layer, short stretch compression wraps daily to one leg at a time using correct gradient techniques with modified assistance (extra time)  to achieve optimal limb volume reduction, to return affected limb/s, as closely  as possible, to premorbid size and shape, to limit infection risk, and to improve safe functional ambulation and mobility.    Baseline  dependent    Time  4    Period  Days    Status  Achieved      OT LONG TERM GOAL #2   Title  Pt will be able to verbalize signs and symptoms of cellulitis infection and identify 4 common lymphedema precautions using printed resource for reference to limit LE progression over time.    Baseline  Max A    Time  4    Period  Days    Status  Achieved      OT LONG TERM GOAL #3   Title  Pt will achieve and sustain no less than  85%  compliance with daily LE self-care home program (skin care, lymphatic pumping therex, compression and simple self-MLD) during Intensive Phase CDT to limit limb swelling, reduce infection risk and limit LE progression.    Baseline  Max A    Time  12    Period  Weeks    Status  Achieved      OT LONG TERM GOAL #4   Title  Pt to  achieve at least 10% BLE limb volume reduction in BLE below the knees, during Intensive Phase CDT to improve safe functional mobility and ambulation, to improve functional performance of basic and instrumental ADLs, to limit infection risk and chronic LE progression.    Baseline  Max A    Time  12    Period  Weeks    Status  Partially Met      OT LONG TERM GOAL #5   Title  Once issued Pt will be able to don and doff appropriate compression garments and/ or devices using correct techniques and assistive devices with modified independence and extra time for optimal LE management to limit progression over time.    Baseline  Max A    Time  12    Period  Weeks    Status  On-going      OT LONG TERM GOAL #6   Title  During self-management phase of CDT Pt will retain limb volume reductions achieved during Intensive Phase CDT with no more than 3% volume increase to limit LE progression and further functional decline.    Baseline  Max A    Time  6    Period  Months    Status  On-going            Plan - 08/06/19 1118    Clinical Impression Statement  Completed LLE comparative limb volumetrics. LLE A-D  limb volume is INcreased  by 3.8% since last measured on 4/8. Total LLE limb volume decrease to date measures  6.089% in LLE. GOAL ongoing.  LLE appears to have reached a clinical plateau in voume reduction, Further reduction may be acheived over time, but Pt and I deceded to proceed with LLE garment measurements and then transition to RLE CDT Intensive. LLE A-D  limb volume is INcreased  by 3.8% since last measured on 4/8. Total LLE limb volume decrease to date measures  6.089% in LLE. GOAL ongoing. Completed custom compression garment measurements for LLE, custom, Jobt ELVAREX Classic, ccl 2  flat knit ( 23-32 mmHg) open toe, silicone top band and oblique top edge to improve fit.We'll assess fit and function and Pt comfort ad may opt to increase compression to flat knit ccl 3 ( 34-46 mmHg) if ccl 2  flat knit provides insufficient compression and containment. Cont as per POC. Luna forms complete and awaiting Pt signature for privacy forms.    OT Occupational Profile and History  Comprehensive Assessment- Review of records and extensive additional review of physical, cognitive, psychosocial history related to current functional performance    Occupational performance deficits (Please refer to evaluation for details):  ADL's;Work;IADL's;Leisure;Rest and Sleep;Social Participation;Other   body image, self esteme   Body Structure / Function / Physical Skills  ADL;ROM;Obesity;IADL;Edema;Balance;Decreased knowledge of precautions;Gait;Pain;Skin integrity;Mobility;Decreased knowledge of use of DME    Rehab Potential  Good    Clinical Decision Making  Several treatment options, min-mod task modification necessary    Comorbidities Affecting Occupational Performance:  Presence of comorbidities impacting occupational performance    Comorbidities impacting occupational performance description:  lipidema, suspected CVI    Modification or Assistance to Complete Evaluation   Min-Moderate modification of tasks or assist with assess necessary to complete eval    OT Frequency  2x / week    OT Duration  12 weeks    OT Treatment/Interventions  Self-care/ADL training;Therapeutic exercise;Functional Mobility Training;Manual lymph drainage;Therapeutic activities;Other (comment);Compression bandaging;Manual Therapy;DME and/or AE instruction   skin care with low ph Raoul Pitch (castor oil) and compression garment / device measurement and fitting   Plan  CDT: manual lymphatic drainage (MLD) . skin care, lymphatic pumping ther ex, compression with multilayer  , short stretch compression wraps-single leg at a time , knee length. Fit with FLAT KNIT, CUSTOM<, KNEE LENGTH, ccl3 (35-45 mmHg) JOBST ELVAREX CLASSIC ELASTI COMPRESSION STOCKINgs for full time daily use. Consider fitting with [employer], Flexitouch, sequential  pneumatic compression device, or "pump" for optimal LE self management over time at home. The 32-chamber Flexitouch is the only available sequential pneumatic compression device providing proximal to distal lymphatic fluid return via regional lymph nodes (LN) deep abdominal pathways and the thoracic duct to the heart. The basic pneumatic pump is not appropriate for this patient because it provides distal-to-proximal, retrograde massage mobilizing tissue fluid against back pressure which then abruptly ends before reaching regional LNs leaving dense, protein rich fluid at the joint.    Consulted and Agree with Plan of Care  Patient       Patient will benefit from skilled therapeutic intervention in order to improve the following deficits and impairments:   Body Structure / Function / Physical Skills: ADL, ROM, Obesity, IADL, Edema, Balance, Decreased knowledge of precautions, Gait, Pain, Skin integrity, Mobility, Decreased knowledge of use of DME       Visit Diagnosis: Lymphedema, not elsewhere classified    Problem List Patient Active Problem List   Diagnosis Date Noted  . Vitamin D deficiency 02/13/2019  . Hyperlipidemia 01/09/2014  . Routine general medical examination at a health care facility 10/07/2013  . Elevated BP 10/07/2013  . Morbid obesity ([employer] [employer] [place]) 10/07/2013    Andrey Spearman, MS, OTR/L, [facility] 08/06/19 11:25 AM   [place] MAIN [facility] SERVICES [address] [place], [place], [address] Phone: [phone]   Fax:  [phone]  Name: VERENA SHAWGO MRN: [id number] Date of Birth: 1955/08/08

## 2019-08-08 ENCOUNTER — Other Ambulatory Visit: Payer: Self-pay

## 2019-08-08 ENCOUNTER — Ambulatory Visit: Payer: BC Managed Care – PPO | Admitting: Occupational Therapy

## 2019-08-08 DIAGNOSIS — I890 Lymphedema, not elsewhere classified: Secondary | ICD-10-CM

## 2019-08-08 NOTE — Therapy (Signed)
[place] MAIN [facility] SERVICES [address] [place], [place], [address] Phone: [phone]   Fax:  [phone]  Occupational Therapy Treatment  Patient Details  Name: Mary Montgomery MRN: [id number] Date of Birth: 1955-10-09 Referring Provider (OT): Annye Asa, MD   Encounter Date: 08/08/2019  OT End of Session - 08/08/19 1622    Visit Number  16    Number of Visits  36    Date for OT Re-Evaluation  09/08/19    Activity Tolerance  Patient tolerated treatment well;No increased pain    Behavior During Therapy  WFL for tasks assessed/performed       Past Medical History:  Diagnosis Date  . Arthritis    right knee  . Bulging disc    Lumber 3/4  . Degenerated intervertebral disc    neck  . Hay fever   . Hyperlipemia   . Hypertension   . Plantar fasciitis    bilaterl feet    Past Surgical History:  Procedure Laterality Date  . CYSTECTOMY  1988   polynidal cyst  . WISDOM TOOTH EXTRACTION  1977    There were no vitals filed for this visit.  Subjective Assessment - 08/08/19 1614    Subjective   Mary Montgomery presents for OT visit 14/36 to address BLE lymphedema. Pt denies leg pain today. Pt asks questions re lipedema and knee replacement, and lipedema and liposuction. Pt expresses interest bc she needs a joint replacement and is concerned physician will still refuse until she loses additional weight. OT educated Pt re difficulty of changing body fat distribution with lipedema with surgery as it is a metaboloc disorder of fat storage. Deposition pattern will not be altered by plastc sx procedures.Provided links for physicians around the [place] who are well versed in Lipedema rx and encouraged her to email the lipedema project.    Pertinent History  Dermatologist ID'd lipedema in ~ 11/2018; Recent 30 #weight less, +family hx of leg swelling-father,    Limitations  chronic leg swelling and pain, limit functional ambulation and  transfers, hx plantar fasciitis, OA, decreased balance    Repetition  Increases Symptoms    Special Tests  negative Stemmer sign base of toes bilaterally    Patient Stated Goals  learn about lymphedema and find out what I can do with it to improve my health    Currently in Pain?  No/denies                           OT Education - 08/08/19 1621    Education Details  Pt edu for lipo-lymphedema presentation , etiology and rx    Person(s) Educated  Patient    Methods  Explanation;Demonstration    Comprehension  Verbalized understanding;Returned demonstration;Need further instruction          OT Long Term Goals - 07/18/19 1400      OT LONG TERM GOAL #1   Title  Pt will be able to apply BLE, knee length, multi-layer, short stretch compression wraps daily to one leg at a time using correct gradient techniques with modified assistance (extra time)  to achieve optimal limb volume reduction, to return affected limb/s, as closely as possible, to premorbid size and shape, to limit infection risk, and to improve safe functional ambulation and mobility.    Baseline  dependent    Time  4    Period  Days    Status  Achieved  OT LONG TERM GOAL #2   Title  Pt will be able to verbalize signs and symptoms of cellulitis infection and identify 4 common lymphedema precautions using printed resource for reference to limit LE progression over time.    Baseline  Max A    Time  4    Period  Days    Status  Achieved      OT LONG TERM GOAL #3   Title  Pt will achieve and sustain no less than  85%  compliance with daily LE self-care home program (skin care, lymphatic pumping therex, compression and simple self-MLD) during Intensive Phase CDT to limit limb swelling, reduce infection risk and limit LE progression.    Baseline  Max A    Time  12    Period  Weeks    Status  Achieved      OT LONG TERM GOAL #4   Title  Pt to achieve at least 10% BLE limb volume reduction in BLE below  the knees, during Intensive Phase CDT to improve safe functional mobility and ambulation, to improve functional performance of basic and instrumental ADLs, to limit infection risk and chronic LE progression.    Baseline  Max A    Time  12    Period  Weeks    Status  Partially Met      OT LONG TERM GOAL #5   Title  Once issued Pt will be able to don and doff appropriate compression garments and/ or devices using correct techniques and assistive devices with modified independence and extra time for optimal LE management to limit progression over time.    Baseline  Max A    Time  12    Period  Weeks    Status  On-going      OT LONG TERM GOAL #6   Title  During self-management phase of CDT Pt will retain limb volume reductions achieved during Intensive Phase CDT with no more than 3% volume increase to limit LE progression and further functional decline.    Baseline  Max A    Time  6    Period  Months    Status  On-going            Plan - 08/08/19 1622    Clinical Impression Statement  Emphasis of visit on MLD to LLE/LLQ as established, and application of knee length, multilayer, short stretch compression wraps. Pt  continues to perform all LE self care protocols daily with excellent compliance. Cont as per POC.    OT Occupational Profile and History  Comprehensive Assessment- Review of records and extensive additional review of physical, cognitive, psychosocial history related to current functional performance    Occupational performance deficits (Please refer to evaluation for details):  ADL's;Work;IADL's;Leisure;Rest and Sleep;Social Participation;Other   body image, self esteme   Body Structure / Function / Physical Skills  ADL;ROM;Obesity;IADL;Edema;Balance;Decreased knowledge of precautions;Gait;Pain;Skin integrity;Mobility;Decreased knowledge of use of DME    Rehab Potential  Good    Clinical Decision Making  Several treatment options, min-mod task modification necessary     Comorbidities Affecting Occupational Performance:  Presence of comorbidities impacting occupational performance    Comorbidities impacting occupational performance description:  lipidema, suspected CVI    Modification or Assistance to Complete Evaluation   Min-Moderate modification of tasks or assist with assess necessary to complete eval    OT Frequency  2x / week    OT Duration  12 weeks    OT Treatment/Interventions  Self-care/ADL training;Therapeutic  exercise;Functional Mobility Training;Manual lymph drainage;Therapeutic activities;Other (comment);Compression bandaging;Manual Therapy;DME and/or AE instruction   skin care with low ph Raoul Pitch (castor oil) and compression garment / device measurement and fitting   Plan  CDT: manual lymphatic drainage (MLD) . skin care, lymphatic pumping ther ex, compression with multilayer  , short stretch compression wraps-single leg at a time , knee length. Fit with FLAT KNIT, CUSTOM<, KNEE LENGTH, ccl3 (35-45 mmHg) JOBST ELVAREX CLASSIC ELASTI COMPRESSION STOCKINgs for full time daily use. Consider fitting with [employer], Flexitouch, sequential pneumatic compression device, or "pump" for optimal LE self management over time at home. The 32-chamber Flexitouch is the only available sequential pneumatic compression device providing proximal to distal lymphatic fluid return via regional lymph nodes (LN) deep abdominal pathways and the thoracic duct to the heart. The basic pneumatic pump is not appropriate for this patient because it provides distal-to-proximal, retrograde massage mobilizing tissue fluid against back pressure which then abruptly ends before reaching regional LNs leaving dense, protein rich fluid at the joint.    Consulted and Agree with Plan of Care  Patient       Patient will benefit from skilled therapeutic intervention in order to improve the following deficits and impairments:   Body Structure / Function / Physical Skills: ADL, ROM,  Obesity, IADL, Edema, Balance, Decreased knowledge of precautions, Gait, Pain, Skin integrity, Mobility, Decreased knowledge of use of DME       Visit Diagnosis: Lymphedema, not elsewhere classified    Problem List Patient Active Problem List   Diagnosis Date Noted  . Vitamin D deficiency 02/13/2019  . Hyperlipidemia 01/09/2014  . Routine general medical examination at a health care facility 10/07/2013  . Elevated BP 10/07/2013  . Morbid obesity ([place]) 10/07/2013   Andrey Spearman, MS, OTR/L, [facility] 08/08/19 4:24 PM   [place] MAIN [facility] SERVICES [address] [place], [place], [address] Phone: [phone]   Fax:  [phone]  Name: Mary Montgomery MRN: [id number] Date of Birth: [birth year]

## 2019-08-12 ENCOUNTER — Other Ambulatory Visit: Payer: Self-pay

## 2019-08-12 ENCOUNTER — Ambulatory Visit: Payer: BC Managed Care – PPO | Admitting: Occupational Therapy

## 2019-08-12 DIAGNOSIS — I890 Lymphedema, not elsewhere classified: Secondary | ICD-10-CM

## 2019-08-12 NOTE — Therapy (Signed)
[place] MAIN [facility] SERVICES [address] [place], [place], [address] Phone: [phone]   Fax:  [phone]  Occupational Therapy Treatment  Patient Details  Name: Mary Montgomery MRN: [id number] Date of Birth: [birth year] Referring Provider (OT): Annye Asa, MD   Encounter Date: 08/12/2019  OT End of Session - 08/12/19 1559    Visit Number  17    Number of Visits  36    Date for OT Re-Evaluation  09/08/19    OT Start Time  0306    OT Stop Time  0355    OT Time Calculation (min)  49 min    Activity Tolerance  Patient tolerated treatment well;No increased pain    Behavior During Therapy  WFL for tasks assessed/performed       Past Medical History:  Diagnosis Date  . Arthritis    right knee  . Bulging disc    Lumber 3/4  . Degenerated intervertebral disc    neck  . Hay fever   . Hyperlipemia   . Hypertension   . Plantar fasciitis    bilaterl feet    Past Surgical History:  Procedure Laterality Date  . CYSTECTOMY  1988   polynidal cyst  . WISDOM TOOTH EXTRACTION  1977    There were no vitals filed for this visit.  Subjective Assessment - 08/12/19 1551    Subjective   JERRINE URSCHEL presents for OT visit 15/36 to address BLE lymphedema. Pt denies leg pain today. Pt in agreement with plan for Flexitouch trial on 5/13 visit.    Pertinent History  Dermatologist ID'd lipedema in ~ 11/2018; Recent 30 #weight less, +family hx of leg swelling-father,    Limitations  chronic leg swelling and pain, limit functional ambulation and transfers, hx plantar fasciitis, OA, decreased balance    Repetition  Increases Symptoms    Special Tests  negative Stemmer sign base of toes bilaterally    Patient Stated Goals  learn about lymphedema and find out what I can do with it to improve my health                   OT Treatments/Exercises (OP) - 08/12/19 0001      ADLs   ADL Education Given  Yes      Manual Therapy    Manual Therapy  Edema management    Manual Lymphatic Drainage (MLD)  MLD to LLE using short neck sequence, deep abdominals, functional inguinals, and sequential strokes to the thigh, leg and foot. Good tolerance.    Compression Bandaging  multiple layer , knee length , gradient compression wraps to LLE using 8,10 and 12 cm short stretch wraps over 0.4 cm thick RosiIdal foam             OT Education - 08/12/19 1558    Education Details  Continued skilled Pt/caregiver education  And LE ADL training throughout visit for lymphedema self care/ home program, including compression wrapping, compression garment and device wear/care, lymphatic pumping ther ex, simple self-MLD, and skin care. Discussed progress towards goals.    Person(s) Educated  Patient    Methods  Explanation;Demonstration    Comprehension  Verbalized understanding;Returned demonstration          OT Long Term Goals - 07/18/19 1400      OT LONG TERM GOAL #1   Title  Pt will be able to apply BLE, knee length, multi-layer, short stretch compression wraps daily to one leg at a  time using correct gradient techniques with modified assistance (extra time)  to achieve optimal limb volume reduction, to return affected limb/s, as closely as possible, to premorbid size and shape, to limit infection risk, and to improve safe functional ambulation and mobility.    Baseline  dependent    Time  4    Period  Days    Status  Achieved      OT LONG TERM GOAL #2   Title  Pt will be able to verbalize signs and symptoms of cellulitis infection and identify 4 common lymphedema precautions using printed resource for reference to limit LE progression over time.    Baseline  Max A    Time  4    Period  Days    Status  Achieved      OT LONG TERM GOAL #3   Title  Pt will achieve and sustain no less than  85%  compliance with daily LE self-care home program (skin care, lymphatic pumping therex, compression and simple self-MLD) during Intensive  Phase CDT to limit limb swelling, reduce infection risk and limit LE progression.    Baseline  Max A    Time  12    Period  Weeks    Status  Achieved      OT LONG TERM GOAL #4   Title  Pt to achieve at least 10% BLE limb volume reduction in BLE below the knees, during Intensive Phase CDT to improve safe functional mobility and ambulation, to improve functional performance of basic and instrumental ADLs, to limit infection risk and chronic LE progression.    Baseline  Max A    Time  12    Period  Weeks    Status  Partially Met      OT LONG TERM GOAL #5   Title  Once issued Pt will be able to don and doff appropriate compression garments and/ or devices using correct techniques and assistive devices with modified independence and extra time for optimal LE management to limit progression over time.    Baseline  Max A    Time  12    Period  Weeks    Status  On-going      OT LONG TERM GOAL #6   Title  During self-management phase of CDT Pt will retain limb volume reductions achieved during Intensive Phase CDT with no more than 3% volume increase to limit LE progression and further functional decline.    Baseline  Max A    Time  6    Period  Months    Status  On-going            Plan - 08/12/19 1600    Clinical Impression Statement  Emphasis of visit on MLD to LLE/LLQ as established, and application of knee length, multilayer, short stretch compression wraps. Pt  continues to perform all LE self care protocols daily with excellent compliance. Trial with advanced sequential pneumatic compression device ([employer]-Flexitouch) is scheduled with manufacturer's rep on 5/13 during regularly scheduled visit. Cont as per POC.    OT Occupational Profile and History  Comprehensive Assessment- Review of records and extensive additional review of physical, cognitive, psychosocial history related to current functional performance    Occupational performance deficits (Please refer to  evaluation for details):  ADL's;Work;IADL's;Leisure;Rest and Sleep;Social Participation;Other   body image, self esteme   Body Structure / Function / Physical Skills  ADL;ROM;Obesity;IADL;Edema;Balance;Decreased knowledge of precautions;Gait;Pain;Skin integrity;Mobility;Decreased knowledge of use of DME    Rehab  Potential  Good    Clinical Decision Making  Several treatment options, min-mod task modification necessary    Comorbidities Affecting Occupational Performance:  Presence of comorbidities impacting occupational performance    Comorbidities impacting occupational performance description:  lipidema, suspected CVI    Modification or Assistance to Complete Evaluation   Min-Moderate modification of tasks or assist with assess necessary to complete eval    OT Frequency  2x / week    OT Duration  12 weeks    OT Treatment/Interventions  Self-care/ADL training;Therapeutic exercise;Functional Mobility Training;Manual lymph drainage;Therapeutic activities;Other (comment);Compression bandaging;Manual Therapy;DME and/or AE instruction   skin care with low ph Raoul Pitch (castor oil) and compression garment / device measurement and fitting   Plan  CDT: manual lymphatic drainage (MLD) . skin care, lymphatic pumping ther ex, compression with multilayer  , short stretch compression wraps-single leg at a time , knee length. Fit with FLAT KNIT, CUSTOM<, KNEE LENGTH, ccl3 (35-45 mmHg) JOBST ELVAREX CLASSIC ELASTI COMPRESSION STOCKINgs for full time daily use. Consider fitting with [employer], Flexitouch, sequential pneumatic compression device, or "pump" for optimal LE self management over time at home. The 32-chamber Flexitouch is the only available sequential pneumatic compression device providing proximal to distal lymphatic fluid return via regional lymph nodes (LN) deep abdominal pathways and the thoracic duct to the heart. The basic pneumatic pump is not appropriate for this patient because it provides  distal-to-proximal, retrograde massage mobilizing tissue fluid against back pressure which then abruptly ends before reaching regional LNs leaving dense, protein rich fluid at the joint.    Consulted and Agree with Plan of Care  Patient       Patient will benefit from skilled therapeutic intervention in order to improve the following deficits and impairments:   Body Structure / Function / Physical Skills: ADL, ROM, Obesity, IADL, Edema, Balance, Decreased knowledge of precautions, Gait, Pain, Skin integrity, Mobility, Decreased knowledge of use of DME       Visit Diagnosis: Lymphedema, not elsewhere classified    Problem List Patient Active Problem List   Diagnosis Date Noted  . Vitamin D deficiency 02/13/2019  . Hyperlipidemia 01/09/2014  . Routine general medical examination at a health care facility 10/07/2013  . Elevated BP 10/07/2013  . Morbid obesity ([place]) 10/07/2013    Andrey Spearman, MS, OTR/L, [facility] 08/12/19 4:02 PM  [place] MAIN [facility] SERVICES [address] [place], [place], [address] Phone: [phone]   Fax:  [phone]  Name: JAMYRA ZWEIG MRN: [id number] Date of Birth: [birth year]

## 2019-08-15 ENCOUNTER — Ambulatory Visit: Payer: BC Managed Care – PPO | Admitting: Occupational Therapy

## 2019-08-15 ENCOUNTER — Ambulatory Visit: Payer: BC Managed Care – PPO | Admitting: Family Medicine

## 2019-08-15 ENCOUNTER — Other Ambulatory Visit: Payer: Self-pay

## 2019-08-15 DIAGNOSIS — I890 Lymphedema, not elsewhere classified: Secondary | ICD-10-CM | POA: Diagnosis not present

## 2019-08-15 NOTE — Therapy (Signed)
[place] MAIN [facility] SERVICES [address] [place], [place], [address] Phone: [phone]   Fax:  [phone]  Occupational Therapy Treatment  Patient Details  Name: Mary Montgomery MRN: [id number] Date of Birth: 1955-09-03 Referring Provider (OT): Annye Asa, MD   Encounter Date: 08/15/2019  OT End of Session - 08/15/19 1521    Visit Number  18    Number of Visits  36    Date for OT Re-Evaluation  09/08/19    OT Start Time  0305    Activity Tolerance  Patient tolerated treatment well;No increased pain    Behavior During Therapy  WFL for tasks assessed/performed       Past Medical History:  Diagnosis Date  . Arthritis    right knee  . Bulging disc    Lumber 3/4  . Degenerated intervertebral disc    neck  . Hay fever   . Hyperlipemia   . Hypertension   . Plantar fasciitis    bilaterl feet    Past Surgical History:  Procedure Laterality Date  . CYSTECTOMY  1988   polynidal cyst  . WISDOM TOOTH EXTRACTION  1977    There were no vitals filed for this visit.  Subjective Assessment - 08/15/19 1521    Subjective   Mary Montgomery presents for OT visit 18/36 to address BLE lymphedema. Pt denies leg pain today. 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 LLE.    Pertinent History  Dermatologist ID'd lipedema in ~ 11/2018; Recent 30 #weight less, +family hx of leg swelling-father,    Limitations  chronic leg swelling and pain, limit functional ambulation and transfers, hx plantar fasciitis, OA, decreased balance    Repetition  Increases Symptoms    Special Tests  negative Stemmer sign base of toes bilaterally    Patient Stated Goals  learn about lymphedema and find out what I can do with it to improve my health    Currently in Pain?  No/denies                    OT Treatments/Exercises (OP) - 08/15/19 0001      ADLs   ADL Education Given  Yes      Manual Therapy   Manual Therapy  Edema management    Manual therapy comments  Trial with advanced [employer] advanced pneumatic compression device ( [id number]) Flexitouch    Compression Bandaging  multiple layer , knee length , gradient compression wraps to LLE using 8,10 and 12 cm short stretch wraps over 0.4 cm thick RosiIdal foam             OT Education - 08/15/19 1524    Education Details  Pt education for advanced Flexitouch sequential pneumatic compression device ("pump"), including clinical rational, precautions and contraindications, care and, use schedule, and donning and doffing device garments. Pt completed 45 minute trial using 30-40 mmHg and patient's questions were answered throughout trial.    Person(s) Educated  Patient    Methods  Explanation;Demonstration    Comprehension  Verbalized understanding;Returned demonstration          OT Long Term Goals - 07/18/19 1400      OT LONG TERM GOAL #1   Title  Pt will be able to apply BLE, knee length, multi-layer, short stretch compression wraps daily to one leg at  a time using correct gradient techniques with modified assistance (extra time)  to achieve optimal limb volume reduction, to return affected limb/s, as closely as possible, to premorbid size and shape, to limit infection risk, and to improve safe functional ambulation and mobility.    Baseline  dependent    Time  4    Period  Days    Status  Achieved      OT LONG TERM GOAL #2   Title  Pt will be able to verbalize signs and symptoms of cellulitis infection and identify 4 common lymphedema precautions using printed resource for reference to limit LE progression over time.    Baseline  Max A    Time  4    Period  Days    Status  Achieved      OT LONG TERM GOAL #3   Title  Pt will achieve and sustain no less than  85%  compliance with daily  LE self-care home program (skin care, lymphatic pumping therex, compression and simple self-MLD) during Intensive Phase CDT to limit limb swelling, reduce infection risk and limit LE progression.    Baseline  Max A    Time  12    Period  Weeks    Status  Achieved      OT LONG TERM GOAL #4   Title  Pt to achieve at least 10% BLE limb volume reduction in BLE below the knees, during Intensive Phase CDT to improve safe functional mobility and ambulation, to improve functional performance of basic and instrumental ADLs, to limit infection risk and chronic LE progression.    Baseline  Max A    Time  12    Period  Weeks    Status  Partially Met      OT LONG TERM GOAL #5   Title  Once issued Pt will be able to don and doff appropriate compression garments and/ or devices using correct techniques and assistive devices with modified independence and extra time for optimal LE management to limit progression over time.    Baseline  Max A    Time  12    Period  Weeks    Status  On-going      OT LONG TERM GOAL #6   Title  During self-management phase of CDT Pt will retain limb volume reductions achieved during Intensive Phase CDT with no more than 3% volume increase to limit LE progression and further functional decline.    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 She has attempted to reduce leg swelling, tissue changes and associated pain using conservative measures, including elevation and off the shelf compression garments. She is currently undergoing Intensive Phase Complete Decongestive Therapy (CDT). Despite these efforts symptoms of progressive lipo-lymphedema persist. Today Mary Montgomery tolerated full 1 hr. Flexitouch trial utilizing 20-30 mmHg compression to LLE without increased pain and without  shortness of breath. The 32-chamber Flexitouch is an advanced sequential pneumatic compression device providing proximal to distal lymphatic fluid return via regional lymph nodes, (LN) deep abdominal pathways and the thoracic duct to the heart. The basic pneumatic pump is not appropriate for this patient because it provides distal-to-proximal, retrograde massage mobilizing tissue fluid against back pressure which then  abruptly ends before reaching regional LNs leaving dense, protein rich fluid distal to the lymph nodes at joints increasing pain. . The Flexitouch pneumatic compression device is medically necessary for this long term daily in-home use to help manage symptoms of chronic, progressive lymphedema on a daily basis to limit progression and further functional decline    OT Occupational Profile and History  Comprehensive Assessment- Review of records and extensive additional review of physical, cognitive, psychosocial history related to current functional performance    Occupational performance deficits (Please refer to evaluation for details):  ADL's;Work;IADL's;Leisure;Rest and Sleep;Social Participation;Other   body image, self esteme   Body Structure / Function / Physical Skills  ADL;ROM;Obesity;IADL;Edema;Balance;Decreased knowledge of precautions;Gait;Pain;Skin integrity;Mobility;Decreased knowledge of use of DME    Rehab Potential  Good    Clinical Decision Making  Several treatment options, min-mod task modification necessary    Comorbidities Affecting Occupational Performance:  Presence of comorbidities impacting occupational performance    Comorbidities impacting occupational performance description:  lipidema, suspected CVI    Modification or Assistance to Complete Evaluation   Min-Moderate modification of tasks or assist with assess necessary to complete eval    OT Frequency  2x / week    OT Duration  12 weeks    OT Treatment/Interventions  Self-care/ADL training;Therapeutic  exercise;Functional Mobility Training;Manual lymph drainage;Therapeutic activities;Other (comment);Compression bandaging;Manual Therapy;DME and/or AE instruction   skin care with low ph Mary Montgomery (castor oil) and compression garment / device measurement and fitting   Plan  CDT: manual lymphatic drainage (MLD) . skin care, lymphatic pumping ther ex, compression with multilayer  , short stretch compression wraps-single leg at a time , knee length. Fit with FLAT KNIT, CUSTOM<, KNEE LENGTH, ccl3 (35-45 mmHg) JOBST ELVAREX CLASSIC ELASTI COMPRESSION STOCKINgs for full time daily use. Consider fitting with [employer], Flexitouch, sequential pneumatic compression device, or "pump" for optimal LE self management over time at home. The 32-chamber Flexitouch is the only available sequential pneumatic compression device providing proximal to distal lymphatic fluid return via regional lymph nodes (LN) deep abdominal pathways and the thoracic duct to the heart. The basic pneumatic pump is not appropriate for this patient because it provides distal-to-proximal, retrograde massage mobilizing tissue fluid against back pressure which then abruptly ends before reaching regional LNs leaving dense, protein rich fluid at the joint.    Consulted and Agree with Plan of Care  Patient       Patient will benefit from skilled therapeutic intervention in order to improve the following deficits and impairments:   Body Structure / Function / Physical Skills: ADL, ROM, Obesity, IADL, Edema, Balance, Decreased knowledge of precautions, Gait, Pain, Skin integrity, Mobility, Decreased knowledge of use of DME       Visit Diagnosis: Lymphedema, not elsewhere classified    Problem List Patient Active Problem List   Diagnosis Date Noted  . Vitamin D deficiency 02/13/2019  . Hyperlipidemia 01/09/2014  . Routine general medical examination at a health care facility 10/07/2013  . Elevated BP 10/07/2013  . Morbid obesity  ([place]) 10/07/2013     Mary Spearman, Mary Montgomery, Mary Montgomery, [facility] 08/15/19 3:43 PM  [place] MAIN [facility] SERVICES [address] [place], [place], [address] Phone: [phone]   Fax:  [phone]  Name: KAILIANA GRANQUIST MRN: [id number] Date of Birth: 14-Jun-1955

## 2019-08-19 ENCOUNTER — Other Ambulatory Visit: Payer: Self-pay

## 2019-08-19 ENCOUNTER — Ambulatory Visit: Payer: BC Managed Care – PPO | Admitting: Occupational Therapy

## 2019-08-19 DIAGNOSIS — I890 Lymphedema, not elsewhere classified: Secondary | ICD-10-CM | POA: Diagnosis not present

## 2019-08-19 NOTE — Therapy (Signed)
[place] MAIN [facility] SERVICES [address] [place], [place], [address] Phone: [phone]   Fax:  [phone]  Occupational Therapy Treatment  Patient Details  Name: Mary Montgomery MRN: [id number] Date of Birth: Nov 25, 1955 Referring Provider (OT): Annye Asa, MD   Encounter Date: 08/19/2019  OT End of Session - 08/19/19 1609    Visit Number  19    Number of Visits  36    Date for OT Re-Evaluation  09/08/19    OT Start Time  0310    OT Stop Time  0400    OT Time Calculation (min)  50 min    Activity Tolerance  Patient tolerated treatment well;No increased pain    Behavior During Therapy  WFL for tasks assessed/performed       Past Medical History:  Diagnosis Date  . Arthritis    right knee  . Bulging disc    Lumber 3/4  . Degenerated intervertebral disc    neck  . Hay fever   . Hyperlipemia   . Hypertension   . Plantar fasciitis    bilaterl feet    Past Surgical History:  Procedure Laterality Date  . CYSTECTOMY  1988   polynidal cyst  . WISDOM TOOTH EXTRACTION  1977    There were no vitals filed for this visit.  Subjective Assessment - 08/19/19 1607    Subjective   TONIKA EDEN presents for OT visit 19/36 to address BLE lymphedema. Pt reports increaased leg pain after a very active day on Saturday when ahe was on her feet assisting a relative unpack her home. Pt did not rate pain today.    Pertinent History  Dermatologist ID'd lipedema in ~ 11/2018; Recent 30 #weight less, +family hx of leg swelling-father,    Limitations  chronic leg swelling and pain, limit functional ambulation and transfers, hx plantar fasciitis, OA, decreased balance    Repetition  Increases Symptoms    Special Tests  negative Stemmer sign base of toes bilaterally    Patient Stated Goals  learn about lymphedema and find out what I can do with it to improve my health                   OT Treatments/Exercises (OP) - 08/19/19  0001      ADLs   ADL Education Given  Yes      Manual Therapy   Manual Therapy  Edema management;Manual Lymphatic Drainage (MLD);Compression Bandaging    Manual Lymphatic Drainage (MLD)  MLD to LLE using short neck sequence, deep abdominals, functional inguinals, and sequential strokes to the thigh, leg and foot. Good tolerance.    Compression Bandaging  multiple layer , knee length , gradient compression wraps to LLE using 8,10 and 12 cm short stretch wraps over 0.4 cm thick RosiIdal foam             OT Education - 08/19/19 1609    Education Details  Continued skilled Pt/caregiver education  And LE ADL training throughout visit for lymphedema self care/ home program, including compression wrapping, compression garment and device wear/care, lymphatic pumping ther ex, simple self-MLD, and skin care. Discussed progress towards goals.    Person(s) Educated  Patient    Methods  Explanation;Demonstration    Comprehension  Verbalized understanding;Returned demonstration;Need further instruction          OT Long Term Goals - 07/18/19 1400      OT LONG TERM GOAL #1   Title  Pt will be able to apply BLE, knee length, multi-layer, short stretch compression wraps daily to one leg at a time using correct gradient techniques with modified assistance (extra time)  to achieve optimal limb volume reduction, to return affected limb/s, as closely as possible, to premorbid size and shape, to limit infection risk, and to improve safe functional ambulation and mobility.    Baseline  dependent    Time  4    Period  Days    Status  Achieved      OT LONG TERM GOAL #2   Title  Pt will be able to verbalize signs and symptoms of cellulitis infection and identify 4 common lymphedema precautions using printed resource for reference to limit LE progression over time.    Baseline  Max A    Time  4    Period  Days    Status  Achieved      OT LONG TERM GOAL #3   Title  Pt will achieve and sustain no  less than  85%  compliance with daily LE self-care home program (skin care, lymphatic pumping therex, compression and simple self-MLD) during Intensive Phase CDT to limit limb swelling, reduce infection risk and limit LE progression.    Baseline  Max A    Time  12    Period  Weeks    Status  Achieved      OT LONG TERM GOAL #4   Title  Pt to achieve at least 10% BLE limb volume reduction in BLE below the knees, during Intensive Phase CDT to improve safe functional mobility and ambulation, to improve functional performance of basic and instrumental ADLs, to limit infection risk and chronic LE progression.    Baseline  Max A    Time  12    Period  Weeks    Status  Partially Met      OT LONG TERM GOAL #5   Title  Once issued Pt will be able to don and doff appropriate compression garments and/ or devices using correct techniques and assistive devices with modified independence and extra time for optimal LE management to limit progression over time.    Baseline  Max A    Time  12    Period  Weeks    Status  On-going      OT LONG TERM GOAL #6   Title  During self-management phase of CDT Pt will retain limb volume reductions achieved during Intensive Phase CDT with no more than 3% volume increase to limit LE progression and further functional decline.    Baseline  Max A    Time  6    Period  Months    Status  On-going            Plan - 08/19/19 1610    Clinical Impression Statement  Pt tolerated manual therapy and compression wraps to LLE without increased pain or other difficulty today. Limb volume reduction is slow, as is typical with lipo-lymphedema, despite excellent compliance with all LE self-care home program components between visits.  Discussed vendor plan for alternate DME vendor contact Everett Graff) since niether our existing vendor or the vendor used    with alternate insurances are not credentialed with [employer]. Cont as per POC.    OT Occupational Profile and History  Comprehensive  Assessment- Review of records and extensive additional review of physical, cognitive, psychosocial history related to current functional performance    Occupational performance deficits (Please refer to evaluation for details):  ADL's;Work;IADL's;Leisure;Rest and Sleep;Social Participation;Other   body image, self esteme   Body Structure / Function / Physical Skills  ADL;ROM;Obesity;IADL;Edema;Balance;Decreased knowledge of precautions;Gait;Pain;Skin integrity;Mobility;Decreased knowledge of use of DME    Rehab Potential  Good    Clinical Decision Making  Several treatment options, min-mod task modification necessary    Comorbidities Affecting Occupational Performance:  Presence of comorbidities impacting occupational performance    Comorbidities impacting occupational performance description:  lipidema, suspected CVI    Modification or Assistance to Complete Evaluation   Min-Moderate modification of tasks or assist with assess necessary to complete eval    OT Frequency  2x / week    OT Duration  12 weeks    OT Treatment/Interventions  Self-care/ADL training;Therapeutic exercise;Functional Mobility Training;Manual lymph drainage;Therapeutic activities;Other (comment);Compression bandaging;Manual Therapy;DME and/or AE instruction   skin care with low ph Raoul Pitch (castor oil) and compression garment / device measurement and fitting   Plan  CDT: manual lymphatic drainage (MLD) . skin care, lymphatic pumping ther ex, compression with multilayer  , short stretch compression wraps-single leg at a time , knee length. Fit with FLAT KNIT, CUSTOM<, KNEE LENGTH, ccl3 (35-45 mmHg) JOBST ELVAREX CLASSIC ELASTI COMPRESSION STOCKINgs for full time daily use. Consider fitting with [employer], Flexitouch, sequential pneumatic compression device, or "pump" for optimal LE self management over time at home. The 32-chamber Flexitouch is the only available sequential pneumatic compression device providing proximal  to distal lymphatic fluid return via regional lymph nodes (LN) deep abdominal pathways and the thoracic duct to the heart. The basic pneumatic pump is not appropriate for this patient because it provides distal-to-proximal, retrograde massage mobilizing tissue fluid against back pressure which then abruptly ends before reaching regional LNs leaving dense, protein rich fluid at the joint.    Consulted and Agree with Plan of Care  Patient       Patient will benefit from skilled therapeutic intervention in order to improve the following deficits and impairments:   Body Structure / Function / Physical Skills: ADL, ROM, Obesity, IADL, Edema, Balance, Decreased knowledge of precautions, Gait, Pain, Skin integrity, Mobility, Decreased knowledge of use of DME       Visit Diagnosis: Lymphedema, not elsewhere classified    Problem List Patient Active Problem List   Diagnosis Date Noted  . Vitamin D deficiency 02/13/2019  . Hyperlipidemia 01/09/2014  . Routine general medical examination at a health care facility 10/07/2013  . Elevated BP 10/07/2013  . Morbid obesity ([place]) 10/07/2013    Andrey Spearman, MS, OTR/L, [facility] 08/19/19 4:12 PM   [place] MAIN [facility] SERVICES [address] [place], [place], [address] Phone: [phone]   Fax:  [phone]  Name: DENICA WEB MRN: [id number] Date of Birth: 01/27/1956

## 2019-08-22 ENCOUNTER — Ambulatory Visit: Payer: BC Managed Care – PPO | Admitting: Occupational Therapy

## 2019-08-22 ENCOUNTER — Other Ambulatory Visit: Payer: Self-pay

## 2019-08-22 DIAGNOSIS — I890 Lymphedema, not elsewhere classified: Secondary | ICD-10-CM

## 2019-08-23 ENCOUNTER — Encounter: Payer: Self-pay | Admitting: Family Medicine

## 2019-08-23 NOTE — Therapy (Signed)
[place] MAIN [facility] SERVICES [address] [place], [place], [address] Phone: [phone]   Fax:  [phone]  Occupational Therapy Treatment Note and Progress Report:  Lymphedema Care  Patient Details  Name: Mary Montgomery MRN: [id number] Date of Birth: Jan 20, 1956 Referring Provider (OT): Annye Asa, MD   Encounter Date: 08/22/2019  OT End of Session - 08/23/19 0858    Visit Number  20    Number of Visits  36    Date for OT Re-Evaluation  09/08/19    OT Start Time  0310    OT Stop Time  0402    OT Time Calculation (min)  52 min    Activity Tolerance  Patient tolerated treatment well;No increased pain    Behavior During Therapy  WFL for tasks assessed/performed       Past Medical History:  Diagnosis Date  . Arthritis    right knee  . Bulging disc    Lumber 3/4  . Degenerated intervertebral disc    neck  . Hay fever   . Hyperlipemia   . Hypertension   . Plantar fasciitis    bilaterl feet    Past Surgical History:  Procedure Laterality Date  . CYSTECTOMY  1988   polynidal cyst  . WISDOM TOOTH EXTRACTION  1977    There were no vitals filed for this visit.  Subjective Assessment - 08/23/19 0853    Subjective   SHAKILA MAK presents for OT visit 20/36 to address BLE lymphedema. Pt states, "I'm retaining some fluid today." Pt denies leg pain. Pt reports she has appointment early next week with [facility] DME vendor for custom garment measurements. Pt has copy of measurements and specifications we completed  to provide vendor for reference.    Pertinent History  Dermatologist ID'd lipedema in ~ 11/2018; Recent 30 #weight less, +family hx of leg swelling-father,    Limitations  chronic leg swelling and pain, limit functional ambulation and transfers, hx plantar fasciitis, OA, decreased balance    Repetition  Increases Symptoms    Special Tests  negative Stemmer sign base of toes bilaterally    Patient Stated Goals  learn  about lymphedema and find out what I can do with it to improve my health          LYMPHEDEMA/ONCOLOGY QUESTIONNAIRE - 08/23/19 0856      Right Lower Extremity Lymphedema   Other  RLE ankle to tibial tuberosity (A-D) limb volume = 6403.72 ml.    Other  RLE A-D limb volume reduction measures 1.47% since last measured on 07/18/19. Total RLE A-D reduction achieved thus far measures 9.54% since commencing CDT on 06/17/19              OT Treatments/Exercises (OP) - 08/23/19 0001      ADLs   ADL Education Given  Yes      Manual Therapy   Manual Therapy  Edema management;Manual Lymphatic Drainage (MLD);Compression Bandaging    Manual therapy comments  LLE comparative limb volumetrics from A-D (ankle to tibial tuberosity)    Manual Lymphatic Drainage (MLD)  MLD to LLE using short neck sequence, deep abdominals, functional inguinals, and sequential strokes to the thigh, leg and foot. Good tolerance.    Compression Bandaging  multiple layer , knee length , gradient compression wraps to LLE using 8,10 and 12 cm short stretch wraps over 0.4 cm thick RosiIdal foam             OT Education -  08/22/19 0854    Education Details  Continued skilled Pt/caregiver education  And LE ADL training throughout visit for lymphedema self care/ home program, including compression wrapping, compression garment and device wear/care, lymphatic pumping ther ex, simple self-MLD, and skin care. Discussed progress towards goals.    Person(s) Educated  Patient    Methods  Explanation;Demonstration    Comprehension  Verbalized understanding;Returned demonstration;Need further instruction          OT Long Term Goals - 07/18/19 1400      OT LONG TERM GOAL #1   Title  Pt will be able to apply BLE, knee length, multi-layer, short stretch compression wraps daily to one leg at a time using correct gradient techniques with modified assistance (extra time)  to achieve optimal limb volume reduction, to return  affected limb/s, as closely as possible, to premorbid size and shape, to limit infection risk, and to improve safe functional ambulation and mobility.    Baseline  dependent    Time  4    Period  Days    Status  Achieved      OT LONG TERM GOAL #2   Title  Pt will be able to verbalize signs and symptoms of cellulitis infection and identify 4 common lymphedema precautions using printed resource for reference to limit LE progression over time.    Baseline  Max A    Time  4    Period  Days    Status  Achieved      OT LONG TERM GOAL #3   Title  Pt will achieve and sustain no less than  85%  compliance with daily LE self-care home program (skin care, lymphatic pumping therex, compression and simple self-MLD) during Intensive Phase CDT to limit limb swelling, reduce infection risk and limit LE progression.    Baseline  Max A    Time  12    Period  Weeks    Status  Achieved      OT LONG TERM GOAL #4   Title  Pt to achieve at least 10% BLE limb volume reduction in BLE below the knees, during Intensive Phase CDT to improve safe functional mobility and ambulation, to improve functional performance of basic and instrumental ADLs, to limit infection risk and chronic LE progression.    Baseline  Max A    Time  12    Period  Weeks    Status  Partially Met      OT LONG TERM GOAL #5   Title  Once issued Pt will be able to don and doff appropriate compression garments and/ or devices using correct techniques and assistive devices with modified independence and extra time for optimal LE management to limit progression over time.    Baseline  Max A    Time  12    Period  Weeks    Status  On-going      OT LONG TERM GOAL #6   Title  During self-management phase of CDT Pt will retain limb volume reductions achieved during Intensive Phase CDT with no more than 3% volume increase to limit LE progression and further functional decline.    Baseline  Max A    Time  6    Period  Months    Status  On-going             Plan - 08/23/19 0900    Clinical Impression Statement  Completed RLE comparative limb volumetrics to measure progress towards goals. RLE A-D limb  volume reduction measures 1.47% since last measured on 07/18/19. Total RLE A-D reduction achieved thus far measures 9.54% since commencing CDT on 06/17/19. Remaining time spent on MLD, skin care and knee length gradient compression wrapping. Pt tolerated all treatment modalities without increased pain. Cont as POC. Fit custom RLE compression garment ASAP.    OT Occupational Profile and History  Comprehensive Assessment- Review of records and extensive additional review of physical, cognitive, psychosocial history related to current functional performance    Occupational performance deficits (Please refer to evaluation for details):  ADL's;Work;IADL's;Leisure;Rest and Sleep;Social Participation;Other   body image, self esteme   Body Structure / Function / Physical Skills  ADL;ROM;Obesity;IADL;Edema;Balance;Decreased knowledge of precautions;Gait;Pain;Skin integrity;Mobility;Decreased knowledge of use of DME    Rehab Potential  Good    Clinical Decision Making  Several treatment options, min-mod task modification necessary    Comorbidities Affecting Occupational Performance:  Presence of comorbidities impacting occupational performance    Comorbidities impacting occupational performance description:  lipidema, suspected CVI    Modification or Assistance to Complete Evaluation   Min-Moderate modification of tasks or assist with assess necessary to complete eval    OT Frequency  2x / week    OT Duration  12 weeks    OT Treatment/Interventions  Self-care/ADL training;Therapeutic exercise;Functional Mobility Training;Manual lymph drainage;Therapeutic activities;Other (comment);Compression bandaging;Manual Therapy;DME and/or AE instruction   skin care with low ph Raoul Pitch (castor oil) and compression garment / device measurement and fitting    Plan  CDT: manual lymphatic drainage (MLD) . skin care, lymphatic pumping ther ex, compression with multilayer  , short stretch compression wraps-single leg at a time , knee length. Fit with FLAT KNIT, CUSTOM<, KNEE LENGTH, ccl3 (35-45 mmHg) JOBST ELVAREX CLASSIC ELASTI COMPRESSION STOCKINgs for full time daily use. Consider fitting with [employer], Flexitouch, sequential pneumatic compression device, or "pump" for optimal LE self management over time at home. The 32-chamber Flexitouch is the only available sequential pneumatic compression device providing proximal to distal lymphatic fluid return via regional lymph nodes (LN) deep abdominal pathways and the thoracic duct to the heart. The basic pneumatic pump is not appropriate for this patient because it provides distal-to-proximal, retrograde massage mobilizing tissue fluid against back pressure which then abruptly ends before reaching regional LNs leaving dense, protein rich fluid at the joint.    Consulted and Agree with Plan of Care  Patient       Patient will benefit from skilled therapeutic intervention in order to improve the following deficits and impairments:   Body Structure / Function / Physical Skills: ADL, ROM, Obesity, IADL, Edema, Balance, Decreased knowledge of precautions, Gait, Pain, Skin integrity, Mobility, Decreased knowledge of use of DME       Visit Diagnosis: Lymphedema, not elsewhere classified    Problem List Patient Active Problem List   Diagnosis Date Noted  . Vitamin D deficiency 02/13/2019  . Hyperlipidemia 01/09/2014  . Routine general medical examination at a health care facility 10/07/2013  . Elevated BP 10/07/2013  . Morbid obesity ([place]) 10/07/2013    Andrey Spearman, MS, OTR/L, [facility] 08/23/19 9:02 AM  [place] MAIN [facility] SERVICES [address] [place], [place], [address] Phone: [phone]   Fax:  [phone]  Name: EDWINA GROSSBERG MRN:  [id number] Date of Birth: [birth year]

## 2019-08-26 ENCOUNTER — Ambulatory Visit: Payer: BC Managed Care – PPO | Admitting: Occupational Therapy

## 2019-08-26 ENCOUNTER — Other Ambulatory Visit: Payer: Self-pay

## 2019-08-26 DIAGNOSIS — I890 Lymphedema, not elsewhere classified: Secondary | ICD-10-CM

## 2019-08-26 NOTE — Therapy (Signed)
[place] MAIN [facility] SERVICES [address] [place], [place], [address] Phone: [phone]   Fax:  [phone]  Occupational Therapy Treatment  Patient Details  Name: Mary Montgomery MRN: [id number] Date of Birth: 08-19-1955 Referring Provider (OT): Annye Asa, MD   Encounter Date: 08/26/2019  OT End of Session - 08/26/19 1619    Visit Number  21    Number of Visits  36    Date for OT Re-Evaluation  09/08/19    OT Start Time  0310    OT Stop Time  0408    OT Time Calculation (min)  58 min    Activity Tolerance  Patient tolerated treatment well;No increased pain    Behavior During Therapy  WFL for tasks assessed/performed       Past Medical History:  Diagnosis Date  . Arthritis    right knee  . Bulging disc    Lumber 3/4  . Degenerated intervertebral disc    neck  . Hay fever   . Hyperlipemia   . Hypertension   . Plantar fasciitis    bilaterl feet    Past Surgical History:  Procedure Laterality Date  . CYSTECTOMY  1988   polynidal cyst  . WISDOM TOOTH EXTRACTION  1977    There were no vitals filed for this visit.  Subjective Assessment - 08/26/19 1617    Subjective   Mary Montgomery presents for OT visit 21/36 to address BLE lymphedema. Pt reports she saw [employer] vendor this past Friday and got measured for custom compression garments. Pt states she expects to have garments in ~ 2 weeks. Pt reports she provided measurements taken in clinic to vendor for reference.    Pertinent History  Dermatologist ID'd lipedema in ~ 11/2018; Recent 30 #weight less, +family hx of leg swelling-father,    Limitations  chronic leg swelling and pain, limit functional ambulation and transfers, hx plantar fasciitis, OA, decreased balance    Repetition  Increases Symptoms    Special Tests  negative Stemmer sign base of toes bilaterally    Patient Stated Goals  learn about lymphedema and find out what I can do with it to improve  my health                   OT Treatments/Exercises (OP) - 08/26/19 0001      ADLs   ADL Education Given  Yes      Manual Therapy   Manual Therapy  Edema management;Manual Lymphatic Drainage (MLD);Compression Bandaging    Manual therapy comments  LLE comparative limb volumetrics from A-D (ankle to tibial tuberosity)    Manual Lymphatic Drainage (MLD)  MLD to LLE using short neck sequence, deep abdominals, functional inguinals, and sequential strokes to the thigh, leg and foot. Good tolerance.    Compression Bandaging  multiple layer , knee length , gradient compression wraps to LLE using 8,10 and 12 cm short stretch wraps over 0.4 cm thick RosiIdal foam             OT Education - 08/26/19 1618    Education Details  Continued skilled Pt/caregiver education  And LE ADL training throughout visit for lymphedema self care/ home program, including compression wrapping, compression garment and device wear/care, lymphatic pumping ther ex, simple self-MLD, and skin care. Discussed progress towards goals.    Person(s) Educated  Patient    Methods  Explanation;Demonstration    Comprehension  Verbalized understanding;Returned demonstration;Need further instruction  OT Long Term Goals - 07/18/19 1400      OT LONG TERM GOAL #1   Title  Pt will be able to apply BLE, knee length, multi-layer, short stretch compression wraps daily to one leg at a time using correct gradient techniques with modified assistance (extra time)  to achieve optimal limb volume reduction, to return affected limb/s, as closely as possible, to premorbid size and shape, to limit infection risk, and to improve safe functional ambulation and mobility.    Baseline  dependent    Time  4    Period  Days    Status  Achieved      OT LONG TERM GOAL #2   Title  Pt will be able to verbalize signs and symptoms of cellulitis infection and identify 4 common lymphedema precautions using printed resource for  reference to limit LE progression over time.    Baseline  Max A    Time  4    Period  Days    Status  Achieved      OT LONG TERM GOAL #3   Title  Pt will achieve and sustain no less than  85%  compliance with daily LE self-care home program (skin care, lymphatic pumping therex, compression and simple self-MLD) during Intensive Phase CDT to limit limb swelling, reduce infection risk and limit LE progression.    Baseline  Max A    Time  12    Period  Weeks    Status  Achieved      OT LONG TERM GOAL #4   Title  Pt to achieve at least 10% BLE limb volume reduction in BLE below the knees, during Intensive Phase CDT to improve safe functional mobility and ambulation, to improve functional performance of basic and instrumental ADLs, to limit infection risk and chronic LE progression.    Baseline  Max A    Time  12    Period  Weeks    Status  Partially Met      OT LONG TERM GOAL #5   Title  Once issued Pt will be able to don and doff appropriate compression garments and/ or devices using correct techniques and assistive devices with modified independence and extra time for optimal LE management to limit progression over time.    Baseline  Max A    Time  12    Period  Weeks    Status  On-going      OT LONG TERM GOAL #6   Title  During self-management phase of CDT Pt will retain limb volume reductions achieved during Intensive Phase CDT with no more than 3% volume increase to limit LE progression and further functional decline.    Baseline  Max A    Time  6    Period  Months    Status  On-going            Plan - 08/26/19 1619    Clinical Impression Statement  Emphasis of visit on MLD to LLE/LLQ as established, and application of knee length, multilayer, short stretch compression wraps. Pt  continues to perform all LE self care protocols daily with excellent compliance. Cont as per POC.    OT Occupational Profile and History  Comprehensive Assessment- Review of records and extensive  additional review of physical, cognitive, psychosocial history related to current functional performance    Occupational performance deficits (Please refer to evaluation for details):  ADL's;Work;IADL's;Leisure;Rest and Sleep;Social Participation;Other   body image, self esteme   Body  Structure / Function / Physical Skills  ADL;ROM;Obesity;IADL;Edema;Balance;Decreased knowledge of precautions;Gait;Pain;Skin integrity;Mobility;Decreased knowledge of use of DME    Rehab Potential  Good    Clinical Decision Making  Several treatment options, min-mod task modification necessary    Comorbidities Affecting Occupational Performance:  Presence of comorbidities impacting occupational performance    Comorbidities impacting occupational performance description:  lipidema, suspected CVI    Modification or Assistance to Complete Evaluation   Min-Moderate modification of tasks or assist with assess necessary to complete eval    OT Frequency  2x / week    OT Duration  12 weeks    OT Treatment/Interventions  Self-care/ADL training;Therapeutic exercise;Functional Mobility Training;Manual lymph drainage;Therapeutic activities;Other (comment);Compression bandaging;Manual Therapy;DME and/or AE instruction   skin care with low ph Mary Montgomery (castor oil) and compression garment / device measurement and fitting   Plan  CDT: manual lymphatic drainage (MLD) . skin care, lymphatic pumping ther ex, compression with multilayer  , short stretch compression wraps-single leg at a time , knee length. Fit with FLAT KNIT, CUSTOM<, KNEE LENGTH, ccl3 (35-45 mmHg) JOBST ELVAREX CLASSIC ELASTI COMPRESSION STOCKINgs for full time daily use. Consider fitting with [employer], Flexitouch, sequential pneumatic compression device, or "pump" for optimal LE self management over time at home. The 32-chamber Flexitouch is the only available sequential pneumatic compression device providing proximal to distal lymphatic fluid return via  regional lymph nodes (LN) deep abdominal pathways and the thoracic duct to the heart. The basic pneumatic pump is not appropriate for this patient because it provides distal-to-proximal, retrograde massage mobilizing tissue fluid against back pressure which then abruptly ends before reaching regional LNs leaving dense, protein rich fluid at the joint.    Consulted and Agree with Plan of Care  Patient       Patient will benefit from skilled therapeutic intervention in order to improve the following deficits and impairments:   Body Structure / Function / Physical Skills: ADL, ROM, Obesity, IADL, Edema, Balance, Decreased knowledge of precautions, Gait, Pain, Skin integrity, Mobility, Decreased knowledge of use of DME       Visit Diagnosis: Lymphedema, not elsewhere classified    Problem List Patient Active Problem List   Diagnosis Date Noted  . Vitamin D deficiency 02/13/2019  . Hyperlipidemia 01/09/2014  . Routine general medical examination at a health care facility 10/07/2013  . Elevated BP 10/07/2013  . Morbid obesity ([place]) 10/07/2013   Mary Spearman, Mary Montgomery, Mary Montgomery, Mary Montgomery 08/26/19 4:20 PM    [place] MAIN [facility] SERVICES [address] [place], [place], [address] Phone: [phone]   Fax:  [phone]  Name: Mary Montgomery MRN: [id number] Date of Birth: [birth year]

## 2019-08-29 ENCOUNTER — Ambulatory Visit: Payer: BC Managed Care – PPO | Admitting: Occupational Therapy

## 2019-09-05 ENCOUNTER — Ambulatory Visit: Payer: BC Managed Care – PPO | Admitting: Occupational Therapy

## 2019-09-11 ENCOUNTER — Ambulatory Visit (INDEPENDENT_AMBULATORY_CARE_PROVIDER_SITE_OTHER): Payer: BC Managed Care – PPO | Admitting: Family Medicine

## 2019-09-11 ENCOUNTER — Ambulatory Visit: Payer: BC Managed Care – PPO | Attending: Family Medicine | Admitting: Occupational Therapy

## 2019-09-11 ENCOUNTER — Other Ambulatory Visit: Payer: Self-pay

## 2019-09-11 ENCOUNTER — Encounter: Payer: Self-pay | Admitting: Family Medicine

## 2019-09-11 VITALS — BP 121/82 | HR 59 | Temp 97.8°F | Resp 16 | Ht 63.0 in | Wt 251.2 lb

## 2019-09-11 DIAGNOSIS — I890 Lymphedema, not elsewhere classified: Secondary | ICD-10-CM | POA: Diagnosis present

## 2019-09-11 DIAGNOSIS — E785 Hyperlipidemia, unspecified: Secondary | ICD-10-CM

## 2019-09-11 LAB — HEPATIC FUNCTION PANEL
ALT: 22 U/L (ref 0–35)
AST: 19 U/L (ref 0–37)
Albumin: 4.1 g/dL (ref 3.5–5.2)
Alkaline Phosphatase: 60 U/L (ref 39–117)
Bilirubin, Direct: 0.1 mg/dL (ref 0.0–0.3)
Total Bilirubin: 0.8 mg/dL (ref 0.2–1.2)
Total Protein: 6.3 g/dL (ref 6.0–8.3)

## 2019-09-11 LAB — LIPID PANEL
Cholesterol: 258 mg/dL — ABNORMAL HIGH (ref 0–200)
HDL: 76.1 mg/dL (ref >39.00)
LDL Cholesterol: 167 mg/dL — ABNORMAL HIGH (ref 0–99)
NonHDL: 182.04
Total CHOL/HDL Ratio: 3
Triglycerides: 74 mg/dL (ref 0.0–149.0)
VLDL: 14.8 mg/dL (ref 0.0–40.0)

## 2019-09-11 LAB — CBC WITH DIFFERENTIAL/PLATELET
Basophils Absolute: 0.1 10*3/uL (ref 0.0–0.1)
Basophils Relative: 1.2 % (ref 0.0–3.0)
Eosinophils Absolute: 0 10*3/uL (ref 0.0–0.7)
Eosinophils Relative: 1 % (ref 0.0–5.0)
HCT: 43.8 % (ref 36.0–46.0)
Hemoglobin: 14.6 g/dL (ref 12.0–15.0)
Lymphocytes Relative: 29.1 % (ref 12.0–46.0)
Lymphs Abs: 1.4 10*3/uL (ref 0.7–4.0)
MCHC: 33.3 g/dL (ref 30.0–36.0)
MCV: 91.6 fl (ref 78.0–100.0)
Monocytes Absolute: 0.3 10*3/uL (ref 0.1–1.0)
Monocytes Relative: 6.3 % (ref 3.0–12.0)
Neutro Abs: 2.9 10*3/uL (ref 1.4–7.7)
Neutrophils Relative %: 62.4 % (ref 43.0–77.0)
Platelets: 165 10*3/uL (ref 150.0–400.0)
RBC: 4.78 Mil/uL (ref 3.87–5.11)
RDW: 13.7 % (ref 11.5–15.5)
WBC: 4.7 10*3/uL (ref 4.0–10.5)

## 2019-09-11 LAB — BASIC METABOLIC PANEL
BUN: 13 mg/dL (ref 6–23)
CO2: 29 mEq/L (ref 19–32)
Calcium: 9.1 mg/dL (ref 8.4–10.5)
Chloride: 104 mEq/L (ref 96–112)
Creatinine, Ser: 0.45 mg/dL (ref 0.40–1.20)
GFR: 141.16 mL/min (ref >60.00)
Glucose, Bld: 87 mg/dL (ref 70–99)
Potassium: 4 mEq/L (ref 3.5–5.1)
Sodium: 141 mEq/L (ref 135–145)

## 2019-09-11 LAB — TSH: TSH: 1.59 u[IU]/mL (ref 0.35–4.50)

## 2019-09-11 LAB — HEMOGLOBIN A1C: Hgb A1c MFr Bld: 5.8 % (ref 4.6–6.5)

## 2019-09-11 NOTE — Therapy (Signed)
[place] MAIN [facility] SERVICES [address] [place], [place], [address] Phone: [phone]   Fax:  [phone]  Occupational Therapy Treatment Note and Recertification:  Lymphedema Care  Patient Details  Name: Mary Montgomery MRN: [id number] Date of Birth: 1956-01-21 Referring Provider (OT): Annye Asa, MD   Encounter Date: 09/11/2019  OT End of Session - 09/11/19 0911    Visit Number  22    Number of Visits  36    Date for OT Re-Evaluation  12/10/19    OT Start Time  0802    OT Stop Time  0906    OT Time Calculation (min)  64 min    Activity Tolerance  Patient tolerated treatment well;No increased pain    Behavior During Therapy  WFL for tasks assessed/performed       Past Medical History:  Diagnosis Date  . Arthritis    right knee  . Bulging disc    Lumber 3/4  . Degenerated intervertebral disc    neck  . Hay fever   . Hyperlipemia   . Hypertension   . Plantar fasciitis    bilaterl feet    Past Surgical History:  Procedure Laterality Date  . CYSTECTOMY  1988   polynidal cyst  . WISDOM TOOTH EXTRACTION  1977    There were no vitals filed for this visit.  Subjective Assessment - 09/11/19 6546    Subjective   [name] presents for OT visit 22/36 to address BLE lymphedema. Pt denies leg pain this morning. She reports she is still working w [employer] to get her compression garments.    Pertinent History  Dermatologist ID'd lipedema in ~ 11/2018; Recent 30 #weight less, +family hx of leg swelling-father,    Limitations  chronic leg swelling and pain, limit functional ambulation and transfers, hx plantar fasciitis, OA, decreased balance    Repetition  Increases Symptoms    Special Tests  negative Stemmer sign base of toes bilaterally    Patient Stated Goals  learn about lymphedema and find out what I can do with it to improve my health                   OT Treatments/Exercises (OP) - 09/11/19 0001       ADLs   ADL Education Given  Yes      Manual Therapy   Manual Therapy  Edema management;Manual Lymphatic Drainage (MLD);Compression Bandaging    Manual Lymphatic Drainage (MLD)  MLD to RLE using short neck sequence, deep abdominals, functional inguinals, and sequential strokes to the thigh, leg and foot. Good tolerance.    Compression Bandaging  multiple layer , knee length , gradient compression wraps to LLE using 8,10 and 12 cm short stretch wraps over 0.4 cm thick RosiIdal foam             OT Education - 09/11/19 0910    Education Details  Continued skilled Pt/caregiver education  And LE ADL training throughout visit for lymphedema self care/ home program, including compression wrapping, compression garment and device wear/care, lymphatic pumping ther ex, simple self-MLD, and skin care. Discussed progress towards goals.    Person(s) Educated  Patient    Methods  Explanation;Demonstration    Comprehension  Verbalized understanding;Returned demonstration;Need further instruction          OT Long Term Goals - 09/11/19 0900      OT LONG TERM GOAL #1   Title  Pt will be able to  apply BLE, knee length, multi-layer, short stretch compression wraps daily to one leg at a time using correct gradient techniques with modified assistance (extra time)  to achieve optimal limb volume reduction, to return affected limb/s, as closely as possible, to premorbid size and shape, to limit infection risk, and to improve safe functional ambulation and mobility.    Baseline  dependent    Time  4    Period  Days    Status  Achieved      OT LONG TERM GOAL #2   Title  Pt will be able to verbalize signs and symptoms of cellulitis infection and identify 4 common lymphedema precautions using printed resource for reference to limit LE progression over time.    Baseline  Max A    Time  4    Period  Days    Status  Achieved      OT LONG TERM GOAL #3   Title  Pt will achieve and sustain no less than   85%  compliance with daily LE self-care home program (skin care, lymphatic pumping therex, compression and simple self-MLD) during Intensive Phase CDT to limit limb swelling, reduce infection risk and limit LE progression.    Baseline  Max A    Time  12    Period  Weeks    Status  Achieved      OT LONG TERM GOAL #4   Title  Pt to achieve at least 10% BLE limb volume reduction in BLE below the knees, during Intensive Phase CDT to improve safe functional mobility and ambulation, to improve functional performance of basic and instrumental ADLs, to limit infection risk and chronic LE progression.    Baseline  Max A    Time  12    Period  Weeks    Status  Partially Met   9.54% reduction for LLE  A-D. RLE Rx pending L garment fitg     OT LONG TERM GOAL #5   Title  Once issued Pt will be able to don and doff appropriate compression garments and/ or devices using correct techniques and assistive devices with modified independence and extra time for optimal LE management to limit progression over time.    Baseline  Max A    Time  12    Period  Weeks    Status  On-going      OT LONG TERM GOAL #6   Title  During self-management phase of CDT Pt will retain limb volume reductions achieved during Intensive Phase CDT with no more than 3% volume increase to limit LE progression and further functional decline.    Baseline  Max A    Time  6    Period  Months    Status  On-going            Plan - 09/11/19 0916    Clinical Impression Statement  Pt managing LLE swelling w/ compression wraps between visits very well. Commenced RLE MLD today in effort to start reducing this limb volume. We're still awaiting LLE custom garment delivery, so we're not yet wrapping the RLE to limit falls risk. Wraps applied to LLE as typical after manual Rx. Pt able to perforn J stroke using correct technique for neck sequence after skilled review. Cont as per POC.    OT Occupational Profile and History  Comprehensive  Assessment- Review of records and extensive additional review of physical, cognitive, psychosocial history related to current functional performance    Occupational performance deficits (Please  refer to evaluation for details):  ADL's;Work;IADL's;Leisure;Rest and Sleep;Social Participation;Other   body image, self esteme   Body Structure / Function / Physical Skills  ADL;ROM;Obesity;IADL;Edema;Balance;Decreased knowledge of precautions;Gait;Pain;Skin integrity;Mobility;Decreased knowledge of use of DME    Rehab Potential  Good    Clinical Decision Making  Several treatment options, min-mod task modification necessary    Comorbidities Affecting Occupational Performance:  Presence of comorbidities impacting occupational performance    Comorbidities impacting occupational performance description:  lipidema, suspected CVI    Modification or Assistance to Complete Evaluation   Min-Moderate modification of tasks or assist with assess necessary to complete eval    OT Frequency  2x / week    OT Duration  12 weeks    OT Treatment/Interventions  Self-care/ADL training;Therapeutic exercise;Functional Mobility Training;Manual lymph drainage;Therapeutic activities;Other (comment);Compression bandaging;Manual Therapy;DME and/or AE instruction   skin care with low ph Raoul Pitch (castor oil) and compression garment / device measurement and fitting   Plan  CDT: manual lymphatic drainage (MLD) . skin care, lymphatic pumping ther ex, compression with multilayer  , short stretch compression wraps-single leg at a time , knee length. Fit with FLAT KNIT, CUSTOM<, KNEE LENGTH, ccl3 (35-45 mmHg) JOBST ELVAREX CLASSIC ELASTI COMPRESSION STOCKINgs for full time daily use. Consider fitting with [employer], Flexitouch, sequential pneumatic compression device, or "pump" for optimal LE self management over time at home. The 32-chamber Flexitouch is the only available sequential pneumatic compression device providing proximal  to distal lymphatic fluid return via regional lymph nodes (LN) deep abdominal pathways and the thoracic duct to the heart. The basic pneumatic pump is not appropriate for this patient because it provides distal-to-proximal, retrograde massage mobilizing tissue fluid against back pressure which then abruptly ends before reaching regional LNs leaving dense, protein rich fluid at the joint.    Consulted and Agree with Plan of Care  Patient       Patient will benefit from skilled therapeutic intervention in order to improve the following deficits and impairments:   Body Structure / Function / Physical Skills: ADL, ROM, Obesity, IADL, Edema, Balance, Decreased knowledge of precautions, Gait, Pain, Skin integrity, Mobility, Decreased knowledge of use of DME       Visit Diagnosis: Lymphedema, not elsewhere classified - Plan: Ot plan of care cert/re-cert    Problem List Patient Active Problem List   Diagnosis Date Noted  . Vitamin D deficiency 02/13/2019  . Hyperlipidemia 01/09/2014  . Routine general medical examination at a health care facility 10/07/2013  . Elevated BP 10/07/2013  . Morbid obesity ([place]) 10/07/2013    Andrey Spearman, MS, OTR/L, [facility] 09/11/19 9:22 AM  [place] MAIN [facility] SERVICES [address] [place], [place], [address] Phone: [phone]   Fax:  [phone]  Name: NIRVANA BLANCHETT MRN: [id number] Date of Birth: [birth year]

## 2019-09-11 NOTE — Progress Notes (Signed)
   Subjective:    Patient ID: Mary Montgomery, female    DOB: 05/09/1955, 64 y.o.   MRN: [id number]  HPI Obesity- pt is down 8 lbs since last visit.  BMI 44.51.  Pt has been working on portion control and eating more low fat foods.  No regular exercise  Hyperlipidemia- last LDL was 173.  Pt never started the Crestor 10mg  daily.  Wanted to work on diet and exercise.  Eating more low fat foods- chicken instead of beef, less cheese.  Denies CP, SOB, abd pain, N/V.   Review of Systems For ROS see HPI   This visit occurred during the SARS-CoV-2 public health emergency.  Safety protocols were in place, including screening questions prior to the visit, additional usage of staff PPE, and extensive cleaning of exam room while observing appropriate contact time as indicated for disinfecting solutions.       Objective:   Physical Exam Vitals reviewed.  Constitutional:      General: She is not in acute distress.    Appearance: Normal appearance. She is well-developed. She is obese.  HENT:     Head: Normocephalic and atraumatic.  Eyes:     Conjunctiva/sclera: Conjunctivae normal.     Pupils: Pupils are equal, round, and reactive to light.  Neck:     Thyroid: No thyromegaly.  Cardiovascular:     Rate and Rhythm: Normal rate and regular rhythm.     Heart sounds: Normal heart sounds. No murmur.  Pulmonary:     Effort: Pulmonary effort is normal. No respiratory distress.     Breath sounds: Normal breath sounds.  Abdominal:     General: There is no distension.     Palpations: Abdomen is soft.     Tenderness: There is no abdominal tenderness.  Musculoskeletal:     Cervical back: Normal range of motion and neck supple.  Lymphadenopathy:     Cervical: No cervical adenopathy.  Skin:    General: Skin is warm and dry.  Neurological:     Mental Status: She is alert and oriented to person, place, and time.  Psychiatric:        Behavior: Behavior normal.           Assessment & Plan:

## 2019-09-11 NOTE — Patient Instructions (Signed)
Schedule your complete physical in 6 months We'll notify you of your lab results and make any changes if needed Continue to work on healthy diet and regular exercise- you're doing great!!! Call with any questions or concerns Have a great summer!!! 

## 2019-09-11 NOTE — Assessment & Plan Note (Signed)
Pt is down 8 lbs since last visit.  Applauded her efforts.  Encouraged her to continue.  Check labs to risk stratify.

## 2019-09-11 NOTE — Assessment & Plan Note (Signed)
Last LDL 173.  Pt did not start Crestor b/c she wanted to work on diet and exercise.  Pt reports that she will start the medication if LDL is again high.

## 2019-09-13 ENCOUNTER — Ambulatory Visit: Payer: BC Managed Care – PPO | Admitting: Occupational Therapy

## 2019-09-13 ENCOUNTER — Other Ambulatory Visit: Payer: Self-pay

## 2019-09-13 DIAGNOSIS — I890 Lymphedema, not elsewhere classified: Secondary | ICD-10-CM

## 2019-09-13 NOTE — Therapy (Signed)
[place] MAIN [facility] SERVICES [address] [place], [place], [address] Phone: [phone]   Fax:  [phone]  Occupational Therapy Treatment  Patient Details  Name: Mary Montgomery MRN: [id number] Date of Birth: 05/14/1955 Referring Provider (OT): Mary Asa, MD   Encounter Date: 09/13/2019   OT End of Session - 09/13/19 0903    Visit Number 23    Number of Visits 36    Date for OT Re-Evaluation 12/10/19    OT Start Time 0801    OT Stop Time 0902    OT Time Calculation (min) 61 min    Activity Tolerance Patient tolerated treatment well;No increased pain    Behavior During Therapy WFL for tasks assessed/performed           Past Medical History:  Diagnosis Date  . Arthritis    right knee  . Bulging disc    Lumber 3/4  . Degenerated intervertebral disc    neck  . Hay fever   . Hyperlipemia   . Hypertension   . Plantar fasciitis    bilaterl feet    Past Surgical History:  Procedure Laterality Date  . CYSTECTOMY  1988   polynidal cyst  . WISDOM TOOTH EXTRACTION  1977    There were no vitals filed for this visit.   Subjective Assessment - 09/13/19 0805    Subjective  Mary Montgomery presents for OT visit 23/36 to address BLE lymphedema. Pt denies leg pain this morning. Pt has no new complaints or concerns.    Pertinent History Dermatologist ID'd lipedema in ~ 11/2018; Recent 30 #weight less, +family hx of leg swelling-father,    Limitations chronic leg swelling and pain, limit functional ambulation and transfers, hx plantar fasciitis, OA, decreased balance    Repetition Increases Symptoms    Special Tests negative Stemmer sign base of toes bilaterally    Patient Stated Goals learn about lymphedema and find out what I can do with it to improve my health                                OT Education - 09/13/19 0806    Education Details Continued skilled Pt/caregiver education  And LE ADL  training throughout visit for lymphedema self care/ home program, including compression wrapping, compression garment and device wear/care, lymphatic pumping ther ex, simple self-MLD, and skin care. Discussed progress towards goals.    Person(s) Educated Patient    Methods Explanation;Demonstration    Comprehension Verbalized understanding;Returned demonstration;Need further instruction               OT Long Term Goals - 09/11/19 0900      OT LONG TERM GOAL #1   Title Pt will be able to apply BLE, knee length, multi-layer, short stretch compression wraps daily to one leg at a time using correct gradient techniques with modified assistance (extra time)  to achieve optimal limb volume reduction, to return affected limb/s, as closely as possible, to premorbid size and shape, to limit infection risk, and to improve safe functional ambulation and mobility.    Baseline dependent    Time 4    Period Days    Status Achieved      OT LONG TERM GOAL #2   Title Pt will be able to verbalize signs and symptoms of cellulitis infection and identify 4 common lymphedema precautions using printed resource for reference to limit LE progression  over time.    Baseline Max A    Time 4    Period Days    Status Achieved      OT LONG TERM GOAL #3   Title Pt will achieve and sustain no less than  85%  compliance with daily LE self-care home program (skin care, lymphatic pumping therex, compression and simple self-MLD) during Intensive Phase CDT to limit limb swelling, reduce infection risk and limit LE progression.    Baseline Max A    Time 12    Period Weeks    Status Achieved      OT LONG TERM GOAL #4   Title Pt to achieve at least 10% BLE limb volume reduction in BLE below the knees, during Intensive Phase CDT to improve safe functional mobility and ambulation, to improve functional performance of basic and instrumental ADLs, to limit infection risk and chronic LE progression.    Baseline Max A    Time  12    Period Weeks    Status Partially Met   9.54% reduction for LLE  A-D. RLE Rx pending L garment fitg     OT LONG TERM GOAL #5   Title Once issued Pt will be able to don and doff appropriate compression garments and/ or devices using correct techniques and assistive devices with modified independence and extra time for optimal LE management to limit progression over time.    Baseline Max A    Time 12    Period Weeks    Status On-going      OT LONG TERM GOAL #6   Title During self-management phase of CDT Pt will retain limb volume reductions achieved during Intensive Phase CDT with no more than 3% volume increase to limit LE progression and further functional decline.    Baseline Max A    Time 6    Period Months    Status On-going                 Plan - 09/13/19 0903    Clinical Impression Statement Emphasis of visit on MLD to LLE/LLQ as established, and application of knee length, multilayer, short stretch compression wraps. Pt  continues to perform all LE self care protocols daily with excellent compliance. Cont as per POC.    OT Occupational Profile and History Comprehensive Assessment- Review of records and extensive additional review of physical, cognitive, psychosocial history related to current functional performance    Occupational performance deficits (Please refer to evaluation for details): ADL's;Work;IADL's;Leisure;Rest and Sleep;Social Participation;Other   body image, self esteme   Body Structure / Function / Physical Skills ADL;ROM;Obesity;IADL;Edema;Balance;Decreased knowledge of precautions;Gait;Pain;Skin integrity;Mobility;Decreased knowledge of use of DME    Rehab Potential Good    Clinical Decision Making Several treatment options, min-mod task modification necessary    Comorbidities Affecting Occupational Performance: Presence of comorbidities impacting occupational performance    Comorbidities impacting occupational performance description: lipidema,  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 skin care, lymphatic pumping ther ex, compression with multilayer  , short stretch compression  wraps-single leg at a time , knee length. Fit with FLAT KNIT, CUSTOM<, KNEE LENGTH, ccl3 (35-45 mmHg) JOBST ELVAREX CLASSIC ELASTI COMPRESSION STOCKINgs for full time daily use. Consider fitting with [employer], Flexitouch, sequential pneumatic compression device, or "pump" for optimal LE self management over time at home. The 32-chamber Flexitouch is the only available sequential pneumatic compression device providing proximal to distal lymphatic fluid return via regional lymph nodes (LN) deep abdominal pathways and the thoracic duct to the heart. The basic pneumatic pump is not appropriate for this patient because it provides distal-to-proximal, retrograde massage mobilizing tissue fluid against back pressure which then abruptly ends before reaching regional LNs leaving dense, protein rich fluid at the joint.    Consulted and Agree with Plan of Care Patient           Patient will benefit from skilled therapeutic intervention in order to improve the following deficits and impairments:   Body Structure / Function / Physical Skills: ADL, ROM, Obesity, IADL, Edema, Balance, Decreased knowledge of precautions, Gait, Pain, Skin integrity, Mobility, Decreased knowledge of use of DME       Visit Diagnosis: Lymphedema, not elsewhere  classified    Problem List Patient Active Problem List   Diagnosis Date Noted  . Vitamin D deficiency 02/13/2019  . Hyperlipidemia 01/09/2014  . Routine general medical examination at a health care facility 10/07/2013  . Morbid obesity ([place]) 10/07/2013    Andrey Spearman, MS, OTR/L, [facility] 09/13/19 9:04 AM   [place] MAIN [facility] SERVICES [address] [place], [place], [address] Phone: [phone]   Fax:  [phone]  Name: Mary Montgomery MRN: [id number] Date of Birth: [birth year]

## 2019-09-20 ENCOUNTER — Ambulatory Visit: Payer: BC Managed Care – PPO | Admitting: Occupational Therapy

## 2019-09-20 ENCOUNTER — Other Ambulatory Visit: Payer: Self-pay

## 2019-09-20 DIAGNOSIS — I890 Lymphedema, not elsewhere classified: Secondary | ICD-10-CM

## 2019-09-20 NOTE — Therapy (Signed)
[place] MAIN [facility] SERVICES [address] [place], [place], [address] Phone: [phone]   Fax:  [phone]  Occupational Therapy Treatment  Patient Details  Name: Mary Montgomery MRN: [id number] Date of Birth: 1956/01/29 Referring Provider (OT): Mary Asa, MD   Encounter Date: 09/20/2019   OT End of Session - 09/20/19 0904    Visit Number 24    Number of Visits 36    Date for OT Re-Evaluation 12/10/19    OT Start Time 0802    OT Stop Time 0902    OT Time Calculation (min) 60 min    Activity Tolerance Patient tolerated treatment well;No increased pain    Behavior During Therapy WFL for tasks assessed/performed           Past Medical History:  Diagnosis Date  . Arthritis    right knee  . Bulging disc    Lumber 3/4  . Degenerated intervertebral disc    neck  . Hay fever   . Hyperlipemia   . Hypertension   . Plantar fasciitis    bilaterl feet    Past Surgical History:  Procedure Laterality Date  . CYSTECTOMY  1988   polynidal cyst  . WISDOM TOOTH EXTRACTION  1977    There were no vitals filed for this visit.   Subjective Assessment - 09/20/19 8841    Subjective  Mary Montgomery presents for OT visit 24/36 to address BLE lymphedema. Pt reports, "My knees are bothering me some. When I get in the pool I'm doing something different and that makes them sore at first. I've been climbing stairs in my yard too, and riding my bike." Pt reports knee pain 4/10.    Pertinent History Dermatologist ID'd lipedema in ~ 11/2018; Recent 30 #weight less, +family hx of leg swelling-father,    Limitations chronic leg swelling and pain, limit functional ambulation and transfers, hx plantar fasciitis, OA, decreased balance    Repetition Increases Symptoms    Special Tests negative Stemmer sign base of toes bilaterally    Patient Stated Goals learn about lymphedema and find out what I can do with it to improve my health                         OT Treatments/Exercises (OP) - 09/20/19 0001      ADLs   ADL Education Given Yes (P)       Manual Therapy   Manual Therapy Edema management;Manual Lymphatic Drainage (MLD);Compression Bandaging (P)     Manual Lymphatic Drainage (MLD) MLD to RLE using short neck sequence, deep abdominals, functional inguinals, and sequential strokes to the thigh, leg and foot. Good tolerance. (P)     Compression Bandaging multiple layer , knee length , gradient compression wraps to LLE using 8,10 and 12 cm short stretch wraps over 0.4 cm thick RosiIdal foam (P)                   OT Education - 09/20/19 0903    Education Details Pt edu for donning custom compression garments using assistive devices.    Person(s) Educated Patient    Methods Explanation;Demonstration    Comprehension Verbalized understanding;Returned demonstration;Need further instruction               OT Long Term Goals - 09/11/19 0900      OT LONG TERM GOAL #1   Title Pt will be able to apply BLE, knee length, multi-layer,  short stretch compression wraps daily to one leg at a time using correct gradient techniques with modified assistance (extra time)  to achieve optimal limb volume reduction, to return affected limb/s, as closely as possible, to premorbid size and shape, to limit infection risk, and to improve safe functional ambulation and mobility.    Baseline dependent    Time 4    Period Days    Status Achieved      OT LONG TERM GOAL #2   Title Pt will be able to verbalize signs and symptoms of cellulitis infection and identify 4 common lymphedema precautions using printed resource for reference to limit LE progression over time.    Baseline Max A    Time 4    Period Days    Status Achieved      OT LONG TERM GOAL #3   Title Pt will achieve and sustain no less than  85%  compliance with daily LE self-care home program (skin care, lymphatic pumping therex, compression and simple  self-MLD) during Intensive Phase CDT to limit limb swelling, reduce infection risk and limit LE progression.    Baseline Max A    Time 12    Period Weeks    Status Achieved      OT LONG TERM GOAL #4   Title Pt to achieve at least 10% BLE limb volume reduction in BLE below the knees, during Intensive Phase CDT to improve safe functional mobility and ambulation, to improve functional performance of basic and instrumental ADLs, to limit infection risk and chronic LE progression.    Baseline Max A    Time 12    Period Weeks    Status Partially Met   9.54% reduction for LLE  A-D. RLE Rx pending L garment fitg     OT LONG TERM GOAL #5   Title Once issued Pt will be able to don and doff appropriate compression garments and/ or devices using correct techniques and assistive devices with modified independence and extra time for optimal LE management to limit progression over time.    Baseline Max A    Time 12    Period Weeks    Status On-going      OT LONG TERM GOAL #6   Title During self-management phase of CDT Pt will retain limb volume reductions achieved during Intensive Phase CDT with no more than 3% volume increase to limit LE progression and further functional decline.    Baseline Max A    Time 6    Period Months    Status On-going                  Patient will benefit from skilled therapeutic intervention in order to improve the following deficits and impairments:           Visit Diagnosis: Lymphedema, not elsewhere classified    Problem List Patient Active Problem List   Diagnosis Date Noted  . Vitamin D deficiency 02/13/2019  . Hyperlipidemia 01/09/2014  . Routine general medical examination at a health care facility 10/07/2013  . Morbid obesity ([place]) 10/07/2013   Mary Spearman, MS, OTR/L, [facility] 09/20/19 9:05 AM   [place] MAIN [facility] SERVICES [address] [place], [place], [address] Phone: [phone]   Fax:   [phone]  Name: Mary Montgomery MRN: [id number] Date of Birth: [birth year]

## 2019-09-25 ENCOUNTER — Other Ambulatory Visit: Payer: Self-pay

## 2019-09-25 ENCOUNTER — Ambulatory Visit: Payer: BC Managed Care – PPO | Admitting: Occupational Therapy

## 2019-09-25 DIAGNOSIS — I890 Lymphedema, not elsewhere classified: Secondary | ICD-10-CM

## 2019-09-25 NOTE — Therapy (Signed)
[place] MAIN [facility] SERVICES [address] [place], [place], [address] Phone: [phone]   Fax:  [phone]  Occupational Therapy Treatment  Patient Details  Name: Mary Montgomery MRN: [id number] Date of Birth: [birth year] Referring Provider (OT): Annye Asa, MD   Encounter Date: 09/25/2019   OT End of Session - 09/25/19 1707    Visit Number 25    Number of Visits 36    Date for OT Re-Evaluation 12/10/19    OT Start Time 0805    OT Stop Time 0905    OT Time Calculation (min) 60 min    Activity Tolerance Patient tolerated treatment well;No increased pain    Behavior During Therapy WFL for tasks assessed/performed           Past Medical History:  Diagnosis Date  . Arthritis    right knee  . Bulging disc    Lumber 3/4  . Degenerated intervertebral disc    neck  . Hay fever   . Hyperlipemia   . Hypertension   . Plantar fasciitis    bilaterl feet    Past Surgical History:  Procedure Laterality Date  . CYSTECTOMY  1988   polynidal cyst  . WISDOM TOOTH EXTRACTION  1977    There were no vitals filed for this visit.   Subjective Assessment - 09/25/19 1701    Subjective  Mary Montgomery presents for OT visit 25/36 to address BLE lymphedema. Pt presents wearing RLE custom compression knee length compression garment. "It came Friday. It feels good. I like it." Pt denies leg pain.    Pertinent History Dermatologist ID'd lipedema in ~ 11/2018; Recent 30 #weight less, +family hx of leg swelling-father,    Limitations chronic leg swelling and pain, limit functional ambulation and transfers, hx plantar fasciitis, OA, decreased balance    Repetition Increases Symptoms    Special Tests negative Stemmer sign base of toes bilaterally    Patient Stated Goals learn about lymphedema and find out what I can do with it to improve my health                        OT Treatments/Exercises (OP) - 09/25/19 0001      ADLs     ADL Education Given Yes      Manual Therapy   Manual Therapy Edema management;Manual Lymphatic Drainage (MLD);Compression Bandaging    Manual therapy comments custom RLE flat knit garment assessment    Manual Lymphatic Drainage (MLD) MLD to LLE using short neck sequence, deep abdominals, functional inguinals, and sequential strokes to the thigh, leg and foot. Good tolerance.    Compression Bandaging LLE multiple layer , knee length , gradient compression wraps to LLE using 8,10 and 12 cm short stretch wraps over 0.4 cm thick RosiIdal foam                  OT Education - 09/25/19 1707    Education Details Skilled LE self care training for wear and care regimes for custom elastic compression garments and HO)S device fitted today. Provided  training for donning and doffing garments using assistive devices (friction gloves and nylon/tyvek sock).    Person(s) Educated Patient    Methods Explanation;Demonstration    Comprehension Verbalized understanding;Returned demonstration               OT Long Term Goals - 09/11/19 0900      OT LONG TERM GOAL #1  Title Pt will be able to apply BLE, knee length, multi-layer, short stretch compression wraps daily to one leg at a time using correct gradient techniques with modified assistance (extra time)  to achieve optimal limb volume reduction, to return affected limb/s, as closely as possible, to premorbid size and shape, to limit infection risk, and to improve safe functional ambulation and mobility.    Baseline dependent    Time 4    Period Days    Status Achieved      OT LONG TERM GOAL #2   Title Pt will be able to verbalize signs and symptoms of cellulitis infection and identify 4 common lymphedema precautions using printed resource for reference to limit LE progression over time.    Baseline Max A    Time 4    Period Days    Status Achieved      OT LONG TERM GOAL #3   Title Pt will achieve and sustain no less than  85%   compliance with daily LE self-care home program (skin care, lymphatic pumping therex, compression and simple self-MLD) during Intensive Phase CDT to limit limb swelling, reduce infection risk and limit LE progression.    Baseline Max A    Time 12    Period Weeks    Status Achieved      OT LONG TERM GOAL #4   Title Pt to achieve at least 10% BLE limb volume reduction in BLE below the knees, during Intensive Phase CDT to improve safe functional mobility and ambulation, to improve functional performance of basic and instrumental ADLs, to limit infection risk and chronic LE progression.    Baseline Max A    Time 12    Period Weeks    Status Partially Met   9.54% reduction for LLE  A-D. RLE Rx pending L garment fitg     OT LONG TERM GOAL #5   Title Once issued Pt will be able to don and doff appropriate compression garments and/ or devices using correct techniques and assistive devices with modified independence and extra time for optimal LE management to limit progression over time.    Baseline Max A    Time 12    Period Weeks    Status On-going      OT LONG TERM GOAL #6   Title During self-management phase of CDT Pt will retain limb volume reductions achieved during Intensive Phase CDT with no more than 3% volume increase to limit LE progression and further functional decline.    Baseline Max A    Time 6    Period Months    Status On-going                 Plan - 09/25/19 1708    Clinical Impression Statement RLE custom , ccl 2 Elvarex compression garment appears to fit well. Only concern of this OT is slight bunching at the ankle in skin crease. Pt trialed "It Stays" garment adhesive today at base of toes to assess if this will reduce bunching. Otherwise fit appears good. Pt has washed and worn garment. She feels it is containing swelling well. We'll assess with repeat volumetrics  in a few visits. Cont as per POC.  Pt is comfortable in garment and is independent with donning and  doffing. Cont CDT to LLE ongoing as Pt transitions to Self Management Phase CDT onf RLE today.    OT Occupational Profile and History Comprehensive Assessment- Review of records and extensive additional review of physical,  cognitive, psychosocial history related to current functional performance    Occupational performance deficits (Please refer to evaluation for details): ADL's;Work;IADL's;Leisure;Rest and Sleep;Social Participation;Other   body image, self esteme   Body Structure / Function / Physical Skills ADL;ROM;Obesity;IADL;Edema;Balance;Decreased knowledge of precautions;Gait;Pain;Skin integrity;Mobility;Decreased knowledge of use of DME    Rehab Potential Good    Clinical Decision Making Several treatment options, min-mod task modification necessary    Comorbidities Affecting Occupational Performance: Presence of comorbidities impacting occupational performance    Comorbidities impacting occupational performance description: lipidema, 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 skin care, lymphatic pumping ther ex, compression with multilayer  , short stretch compression wraps-single leg at a time , knee length. Fit with FLAT KNIT, CUSTOM<, KNEE LENGTH, ccl3 (35-45 mmHg) JOBST ELVAREX CLASSIC ELASTI COMPRESSION STOCKINgs for full time daily use. Consider fitting with [employer], Flexitouch, sequential pneumatic compression device, or "pump" for optimal LE self  management over time at home. The 32-chamber Flexitouch is the only available sequential pneumatic compression device providing proximal to distal lymphatic fluid return via regional lymph nodes (LN) deep abdominal pathways and the thoracic duct to the heart. The basic pneumatic pump is not appropriate for this patient because it provides distal-to-proximal, retrograde massage mobilizing tissue fluid against back pressure which then abruptly ends before reaching regional LNs leaving dense, protein rich fluid at the joint.    Consulted and Agree with Plan of Care Patient           Patient will benefit from skilled therapeutic intervention in order to improve the following deficits and impairments:   Body Structure / Function / Physical Skills: ADL, ROM, Obesity, IADL, Edema, Balance, Decreased knowledge of precautions, Gait, Pain, Skin integrity, Mobility, Decreased knowledge of use of DME       Visit Diagnosis: Lymphedema, not elsewhere classified    Problem List Patient Active Problem List   Diagnosis Date Noted  . Vitamin D deficiency 02/13/2019  . Hyperlipidemia 01/09/2014  . Routine general medical examination at a health care facility 10/07/2013  . Morbid obesity ([place]) 10/07/2013    Andrey Spearman, MS, OTR/L, [facility] 09/25/19 5:13 PM  [place] MAIN [facility] SERVICES [address] [place], [place], [address] Phone: [phone]   Fax:  [phone]  Name: Mary Montgomery MRN: [id number] Date of Birth: 1955/06/28

## 2019-09-27 ENCOUNTER — Other Ambulatory Visit: Payer: Self-pay

## 2019-09-27 ENCOUNTER — Ambulatory Visit: Payer: BC Managed Care – PPO | Admitting: Occupational Therapy

## 2019-09-27 DIAGNOSIS — I890 Lymphedema, not elsewhere classified: Secondary | ICD-10-CM | POA: Diagnosis not present

## 2019-09-27 NOTE — Therapy (Signed)
[place] MAIN [facility] SERVICES [address] [place], [place], [address] Phone: [phone]   Fax:  [phone]  Occupational Therapy Treatment  Patient Details  Name: Mary Montgomery MRN: [id number] Date of Birth: 09-17-1955 Referring Provider (OT): Annye Asa, MD   Encounter Date: 09/27/2019   OT End of Session - 09/27/19 0859    Visit Number 26    Number of Visits 36    Date for OT Re-Evaluation 12/10/19    OT Start Time 0803    OT Stop Time 0900    OT Time Calculation (min) 57 min    Activity Tolerance Patient tolerated treatment well;No increased pain    Behavior During Therapy WFL for tasks assessed/performed           Past Medical History:  Diagnosis Date  . Arthritis    right knee  . Bulging disc    Lumber 3/4  . Degenerated intervertebral disc    neck  . Hay fever   . Hyperlipemia   . Hypertension   . Plantar fasciitis    bilaterl feet    Past Surgical History:  Procedure Laterality Date  . CYSTECTOMY  1988   polynidal cyst  . WISDOM TOOTH EXTRACTION  1977    There were no vitals filed for this visit.   Subjective Assessment - 09/27/19 0623    Subjective  Mary Montgomery presents for OT visit 26/36 to address BLE lymphedema. Pt reports R shin is a little sore, 4/10.    Pertinent History Dermatologist ID'd lipedema in ~ 11/2018; Recent 30 #weight less, +family hx of leg swelling-father,    Limitations chronic leg swelling and pain, limit functional ambulation and transfers, hx plantar fasciitis, OA, decreased balance    Repetition Increases Symptoms    Special Tests negative Stemmer sign base of toes bilaterally    Patient Stated Goals learn about lymphedema and find out what I can do with it to improve my health                        OT Treatments/Exercises (OP) - 09/27/19 0001      ADLs   ADL Education Given Yes      Manual Therapy   Manual Therapy Edema management;Manual  Lymphatic Drainage (MLD);Compression Bandaging    Manual therapy comments --    Manual Lymphatic Drainage (MLD) MLD to LLE using short neck sequence, deep abdominals, functional inguinals, and sequential strokes to the thigh, leg and foot. Good tolerance.    Compression Bandaging LLE multiple layer , knee length , gradient compression wraps to LLE using 8,10 and 12 cm short stretch wraps over 0.4 cm thick Mary Montgomery foam                  OT Education - 09/27/19 0859    Education Details Continued skilled Pt/caregiver education  And LE ADL training throughout visit for lymphedema self care/ home program, including compression wrapping, compression garment and device wear/care, lymphatic pumping ther ex, simple self-MLD, and skin care. Discussed progress towards goals.    Person(s) Educated Patient    Methods Explanation;Demonstration    Comprehension Verbalized understanding;Returned demonstration;Need further instruction               OT Long Term Goals - 09/11/19 0900      OT LONG TERM GOAL #1   Title Pt will be able to apply BLE, knee length, multi-layer, short stretch compression wraps daily  to one leg at a time using correct gradient techniques with modified assistance (extra time)  to achieve optimal limb volume reduction, to return affected limb/s, as closely as possible, to premorbid size and shape, to limit infection risk, and to improve safe functional ambulation and mobility.    Baseline dependent    Time 4    Period Days    Status Achieved      OT LONG TERM GOAL #2   Title Pt will be able to verbalize signs and symptoms of cellulitis infection and identify 4 common lymphedema precautions using printed resource for reference to limit LE progression over time.    Baseline Max A    Time 4    Period Days    Status Achieved      OT LONG TERM GOAL #3   Title Pt will achieve and sustain no less than  85%  compliance with daily LE self-care home program (skin care,  lymphatic pumping therex, compression and simple self-MLD) during Intensive Phase CDT to limit limb swelling, reduce infection risk and limit LE progression.    Baseline Max A    Time 12    Period Weeks    Status Achieved      OT LONG TERM GOAL #4   Title Pt to achieve at least 10% BLE limb volume reduction in BLE below the knees, during Intensive Phase CDT to improve safe functional mobility and ambulation, to improve functional performance of basic and instrumental ADLs, to limit infection risk and chronic LE progression.    Baseline Max A    Time 12    Period Weeks    Status Partially Met   9.54% reduction for LLE  A-D. RLE Rx pending L garment fitg     OT LONG TERM GOAL #5   Title Once issued Pt will be able to don and doff appropriate compression garments and/ or devices using correct techniques and assistive devices with modified independence and extra time for optimal LE management to limit progression over time.    Baseline Max A    Time 12    Period Weeks    Status On-going      OT LONG TERM GOAL #6   Title During self-management phase of CDT Pt will retain limb volume reductions achieved during Intensive Phase CDT with no more than 3% volume increase to limit LE progression and further functional decline.    Baseline Max A    Time 6    Period Months    Status On-going                 Plan - 09/27/19 0900    Clinical Impression Statement Pt tolerated manual therapy and compression wraps to RLE without increased pain or other difficulty today. Pt continues to be 100% compliant with all home program self-care components. Cont as per POC.    OT Occupational Profile and History Comprehensive Assessment- Review of records and extensive additional review of physical, cognitive, psychosocial history related to current functional performance    Occupational performance deficits (Please refer to evaluation for details): ADL's;Work;IADL's;Leisure;Rest and Sleep;Social  Participation;Other   body image, self esteme   Body Structure / Function / Physical Skills ADL;ROM;Obesity;IADL;Edema;Balance;Decreased knowledge of precautions;Gait;Pain;Skin integrity;Mobility;Decreased knowledge of use of DME    Rehab Potential Good    Clinical Decision Making Several treatment options, min-mod task modification necessary    Comorbidities Affecting Occupational Performance: Presence of comorbidities impacting occupational performance    Comorbidities impacting occupational performance  description: lipidema, 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 skin care, lymphatic pumping ther ex, compression with multilayer  , short stretch compression wraps-single leg at a time , knee length. Fit with FLAT KNIT, CUSTOM<, KNEE LENGTH, ccl3 (35-45 mmHg) JOBST ELVAREX CLASSIC ELASTI COMPRESSION STOCKINgs for full time daily use. Consider fitting with [employer], Flexitouch, sequential pneumatic compression device, or "pump" for optimal LE self management over time at home. The 32-chamber Flexitouch is the only available sequential pneumatic compression device providing proximal to distal lymphatic fluid return via regional lymph nodes (LN) deep abdominal pathways and the thoracic duct to the heart. The basic pneumatic pump is not appropriate for this patient because it provides distal-to-proximal, retrograde massage mobilizing tissue fluid against back pressure  which then abruptly ends before reaching regional LNs leaving dense, protein rich fluid at the joint.    Consulted and Agree with Plan of Care Patient           Patient will benefit from skilled therapeutic intervention in order to improve the following deficits and impairments:   Body Structure / Function / Physical Skills: ADL, ROM, Obesity, IADL, Edema, Balance, Decreased knowledge of precautions, Gait, Pain, Skin integrity, Mobility, Decreased knowledge of use of DME       Visit Diagnosis: Lymphedema, not elsewhere classified    Problem List Patient Active Problem List   Diagnosis Date Noted  . Vitamin D deficiency 02/13/2019  . Hyperlipidemia 01/09/2014  . Routine general medical examination at a health care facility 10/07/2013  . Morbid obesity ([place]) 10/07/2013    Andrey Spearman, MS, OTR/L, [facility] 09/27/19 9:03 AM   [place] MAIN [facility] SERVICES [address] [place], [place], [address] Phone: [phone]   Fax:  [phone]  Name: PHYLISS HULICK MRN: [id number] Date of Birth: [birth year]

## 2019-10-01 ENCOUNTER — Other Ambulatory Visit: Payer: Self-pay

## 2019-10-01 ENCOUNTER — Ambulatory Visit: Payer: BC Managed Care – PPO | Admitting: Occupational Therapy

## 2019-10-01 DIAGNOSIS — I890 Lymphedema, not elsewhere classified: Secondary | ICD-10-CM

## 2019-10-01 NOTE — Therapy (Signed)
[place] MAIN [facility] SERVICES [address] [place], [place], [address] Phone: [phone]   Fax:  [phone]  Occupational Therapy Treatment  Patient Details  Name: Mary Montgomery MRN: [id number] Date of Birth: [birth year] Referring Provider (OT): Annye Asa, MD   Encounter Date: 10/01/2019   OT End of Session - 10/01/19 0928    Visit Number 27    Number of Visits 36    Date for OT Re-Evaluation 12/10/19    OT Start Time 0805    OT Stop Time 0905    OT Time Calculation (min) 60 min    Activity Tolerance Patient tolerated treatment well;No increased pain    Behavior During Therapy WFL for tasks assessed/performed           Past Medical History:  Diagnosis Date  . Arthritis    right knee  . Bulging disc    Lumber 3/4  . Degenerated intervertebral disc    neck  . Hay fever   . Hyperlipemia   . Hypertension   . Plantar fasciitis    bilaterl feet    Past Surgical History:  Procedure Laterality Date  . CYSTECTOMY  1988   polynidal cyst  . WISDOM TOOTH EXTRACTION  1977    There were no vitals filed for this visit.   Subjective Assessment - 10/01/19 5638    Subjective  Mary Montgomery presents for OT visit 26/36 to address BLE lymphedema. Pt reports R shin is a little sore, 4/10.    Pertinent History Dermatologist ID'd lipedema in ~ 11/2018; Recent 30 #weight less, +family hx of leg swelling-father,    Limitations chronic leg swelling and pain, limit functional ambulation and transfers, hx plantar fasciitis, OA, decreased balance    Repetition Increases Symptoms    Special Tests negative Stemmer sign base of toes bilaterally    Patient Stated Goals learn about lymphedema and find out what I can do with it to improve my health                        OT Treatments/Exercises (OP) - 10/01/19 0001      ADLs   ADL Education Given Yes      Manual Therapy   Manual Therapy Edema management;Manual  Lymphatic Drainage (MLD);Compression Bandaging    Manual Lymphatic Drainage (MLD) MLD to RLE using short neck sequence, deep abdominals, functional inguinals, and sequential strokes to the thigh, leg and foot. Good tolerance.    Compression Bandaging RLE multiple layer , knee length , gradient compression wraps to LLE using 8,10 and 12 cm short stretch wraps over 0.4 cm thick RosiIdal foam                  OT Education - 10/01/19 0928    Education Details Continued skilled Pt/caregiver education  And LE ADL training throughout visit for lymphedema self care/ home program, including compression wrapping, compression garment and device wear/care, lymphatic pumping ther ex, simple self-MLD, and skin care. Discussed progress towards goals.    Person(s) Educated Patient    Methods Explanation;Demonstration    Comprehension Verbalized understanding;Returned demonstration;Need further instruction               OT Long Term Goals - 09/11/19 0900      OT LONG TERM GOAL #1   Title Pt will be able to apply BLE, knee length, multi-layer, short stretch compression wraps daily to one leg at a time using  correct gradient techniques with modified assistance (extra time)  to achieve optimal limb volume reduction, to return affected limb/s, as closely as possible, to premorbid size and shape, to limit infection risk, and to improve safe functional ambulation and mobility.    Baseline dependent    Time 4    Period Days    Status Achieved      OT LONG TERM GOAL #2   Title Pt will be able to verbalize signs and symptoms of cellulitis infection and identify 4 common lymphedema precautions using printed resource for reference to limit LE progression over time.    Baseline Max A    Time 4    Period Days    Status Achieved      OT LONG TERM GOAL #3   Title Pt will achieve and sustain no less than  85%  compliance with daily LE self-care home program (skin care, lymphatic pumping therex,  compression and simple self-MLD) during Intensive Phase CDT to limit limb swelling, reduce infection risk and limit LE progression.    Baseline Max A    Time 12    Period Weeks    Status Achieved      OT LONG TERM GOAL #4   Title Pt to achieve at least 10% BLE limb volume reduction in BLE below the knees, during Intensive Phase CDT to improve safe functional mobility and ambulation, to improve functional performance of basic and instrumental ADLs, to limit infection risk and chronic LE progression.    Baseline Max A    Time 12    Period Weeks    Status Partially Met   9.54% reduction for LLE  A-D. RLE Rx pending L garment fitg     OT LONG TERM GOAL #5   Title Once issued Pt will be able to don and doff appropriate compression garments and/ or devices using correct techniques and assistive devices with modified independence and extra time for optimal LE management to limit progression over time.    Baseline Max A    Time 12    Period Weeks    Status On-going      OT LONG TERM GOAL #6   Title During self-management phase of CDT Pt will retain limb volume reductions achieved during Intensive Phase CDT with no more than 3% volume increase to limit LE progression and further functional decline.    Baseline Max A    Time 6    Period Months    Status On-going                 Plan - 10/01/19 3299    Clinical Impression Statement Emphasis of visit on MLD to RLE/RLQ as established. Application of knee length, multilayer, short stretch compression wraps. Pt  continues to perform all LE self care protocols daily with excellent compliance. Limb volume is slow, but visible and palpable, as is typical with lipo-lymphedema. Cont as per POC.    OT Occupational Profile and History Comprehensive Assessment- Review of records and extensive additional review of physical, cognitive, psychosocial history related to current functional performance    Occupational performance deficits (Please refer to  evaluation for details): ADL's;Work;IADL's;Leisure;Rest and Sleep;Social Participation;Other   body image, self esteme   Body Structure / Function / Physical Skills ADL;ROM;Obesity;IADL;Edema;Balance;Decreased knowledge of precautions;Gait;Pain;Skin integrity;Mobility;Decreased knowledge of use of DME    Rehab Potential Good    Clinical Decision Making Several treatment options, min-mod task modification necessary    Comorbidities Affecting Occupational Performance: Presence of comorbidities  impacting occupational performance    Comorbidities impacting occupational performance description: lipidema, suspected CVI    Modification or Assistance to Complete Evaluation  Min-Moderate modification of tasks or assist with assess necessary to complete eval    OT Frequency 2x / week    OT Duration 12 weeks    OT Treatment/Interventions Self-care/ADL training;Therapeutic exercise;Functional Mobility Training;Manual lymph drainage;Therapeutic activities;Other (comment);Compression bandaging;Manual Therapy;DME and/or AE instruction   skin care with low ph Mary Montgomery (castor oil) and compression garment / device measurement and fitting   Plan CDT: manual lymphatic drainage (MLD) . skin care, lymphatic pumping ther ex, compression with multilayer  , short stretch compression wraps-single leg at a time , knee length. Fit with FLAT KNIT, CUSTOM<, KNEE LENGTH, ccl3 (35-45 mmHg) JOBST ELVAREX CLASSIC ELASTI COMPRESSION STOCKINgs for full time daily use. Consider fitting with [employer], Flexitouch, sequential pneumatic compression device, or "pump" for optimal LE self management over time at home. The 32-chamber Flexitouch is the only available sequential pneumatic compression device providing proximal to distal lymphatic fluid return via regional lymph nodes (LN) deep abdominal pathways and the thoracic duct to the heart. The basic pneumatic pump is not appropriate for this patient because it provides  distal-to-proximal, retrograde massage mobilizing tissue fluid against back pressure which then abruptly ends before reaching regional LNs leaving dense, protein rich fluid at the joint.    Consulted and Agree with Plan of Care Patient           Patient will benefit from skilled therapeutic intervention in order to improve the following deficits and impairments:   Body Structure / Function / Physical Skills: ADL, ROM, Obesity, IADL, Edema, Balance, Decreased knowledge of precautions, Gait, Pain, Skin integrity, Mobility, Decreased knowledge of use of DME       Visit Diagnosis: Lymphedema, not elsewhere classified    Problem List Patient Active Problem List   Diagnosis Date Noted  . Vitamin D deficiency 02/13/2019  . Hyperlipidemia 01/09/2014  . Routine general medical examination at a health care facility 10/07/2013  . Morbid obesity ([place]) 10/07/2013   Andrey Spearman, MS, OTR/L, [facility] 10/01/19 9:31 AM   [place] MAIN [facility] SERVICES [address] [place], [place], [address] Phone: [phone]   Fax:  [phone]  Name: Mary Montgomery MRN: [id number] Date of Birth: 1955/09/21

## 2019-10-04 ENCOUNTER — Ambulatory Visit: Payer: BC Managed Care – PPO | Admitting: Occupational Therapy

## 2019-10-08 ENCOUNTER — Ambulatory Visit: Payer: BC Managed Care – PPO | Attending: Family Medicine | Admitting: Occupational Therapy

## 2019-10-08 ENCOUNTER — Other Ambulatory Visit: Payer: Self-pay

## 2019-10-08 DIAGNOSIS — I890 Lymphedema, not elsewhere classified: Secondary | ICD-10-CM | POA: Insufficient documentation

## 2019-10-08 NOTE — Therapy (Signed)
[place] MAIN [facility] SERVICES [address] [place], [place], [address] Phone: [phone]   Fax:  [phone]  Occupational Therapy Treatment  Patient Details  Name: Mary Montgomery MRN: [id number] Date of Birth: 1956-01-03 Referring Provider (OT): Annye Asa, MD   Encounter Date: 10/08/2019   OT End of Session - 10/08/19 1350    Visit Number 28    Number of Visits 36    Date for OT Re-Evaluation 12/10/19    OT Start Time 0802    OT Stop Time 0907    OT Time Calculation (min) 65 min    Activity Tolerance Patient tolerated treatment well;No increased pain    Behavior During Therapy WFL for tasks assessed/performed           Past Medical History:  Diagnosis Date  . Arthritis    right knee  . Bulging disc    Lumber 3/4  . Degenerated intervertebral disc    neck  . Hay fever   . Hyperlipemia   . Hypertension   . Plantar fasciitis    bilaterl feet    Past Surgical History:  Procedure Laterality Date  . CYSTECTOMY  1988   polynidal cyst  . WISDOM TOOTH EXTRACTION  1977    There were no vitals filed for this visit.   Subjective Assessment - 10/08/19 0263    Subjective  [name] presents for OT visit 27/36 to address BLE lymphedema. Pt reports R knee pain 5/10 pain. We agreed to wrap above that knee today in an effort to reduce inflammation.    Pertinent History Dermatologist ID'd lipedema in ~ 11/2018; Recent 30 #weight less, +family hx of leg swelling-father,    Limitations chronic leg swelling and pain, limit functional ambulation and transfers, hx plantar fasciitis, OA, decreased balance    Repetition Increases Symptoms    Special Tests negative Stemmer sign base of toes bilaterally    Patient Stated Goals learn about lymphedema and find out what I can do with it to improve my health               LYMPHEDEMA/ONCOLOGY QUESTIONNAIRE - 10/08/19 0001      Right Lower Extremity Lymphedema   Other RLE  ankle to tibial tuberosity (A-D) limb volume = 6435.1. (Previously 6967.2 on 06/17/19)    Other R leg volume reduction measures 7.63% since commencing OT for  CDTon 06/17/19.                   OT Treatments/Exercises (OP) - 10/08/19 0001      ADLs   ADL Education Given Yes      Manual Therapy   Manual Therapy Edema management;Compression Bandaging;Manual Lymphatic Drainage (MLD)    Edema Management RLE comparative limb volumetrics    Manual Lymphatic Drainage (MLD) MLD to RLE using short neck sequence, deep abdominals, functional inguinals, and sequential strokes to the thigh, leg and foot. Good tolerance.    Compression Bandaging RLE multiple layer , knee length , gradient compression wraps to LLE using 8,10 and 12 cm short stretch wraps over 0.4 cm thick RosiIdal foam                  OT Education - 10/08/19 1350    Education Details Continued skilled Pt/caregiver education  And LE ADL training throughout visit for lymphedema self care/ home program, including compression wrapping, compression garment and device wear/care, lymphatic pumping ther ex, simple self-MLD, and skin care. Discussed progress  towards goals.    Person(s) Educated Patient    Methods Explanation;Demonstration    Comprehension Verbalized understanding;Returned demonstration;Need further instruction               OT Long Term Goals - 09/11/19 0900      OT LONG TERM GOAL #1   Title Pt will be able to apply BLE, knee length, multi-layer, short stretch compression wraps daily to one leg at a time using correct gradient techniques with modified assistance (extra time)  to achieve optimal limb volume reduction, to return affected limb/s, as closely as possible, to premorbid size and shape, to limit infection risk, and to improve safe functional ambulation and mobility.    Baseline dependent    Time 4    Period Days    Status Achieved      OT LONG TERM GOAL #2   Title Pt will be able to verbalize  signs and symptoms of cellulitis infection and identify 4 common lymphedema precautions using printed resource for reference to limit LE progression over time.    Baseline Max A    Time 4    Period Days    Status Achieved      OT LONG TERM GOAL #3   Title Pt will achieve and sustain no less than  85%  compliance with daily LE self-care home program (skin care, lymphatic pumping therex, compression and simple self-MLD) during Intensive Phase CDT to limit limb swelling, reduce infection risk and limit LE progression.    Baseline Max A    Time 12    Period Weeks    Status Achieved      OT LONG TERM GOAL #4   Title Pt to achieve at least 10% BLE limb volume reduction in BLE below the knees, during Intensive Phase CDT to improve safe functional mobility and ambulation, to improve functional performance of basic and instrumental ADLs, to limit infection risk and chronic LE progression.    Baseline Max A    Time 12    Period Weeks    Status Partially Met   9.54% reduction for LLE  A-D. RLE Rx pending L garment fitg     OT LONG TERM GOAL #5   Title Once issued Pt will be able to don and doff appropriate compression garments and/ or devices using correct techniques and assistive devices with modified independence and extra time for optimal LE management to limit progression over time.    Baseline Max A    Time 12    Period Weeks    Status On-going      OT LONG TERM GOAL #6   Title During self-management phase of CDT Pt will retain limb volume reductions achieved during Intensive Phase CDT with no more than 3% volume increase to limit LE progression and further functional decline.    Baseline Max A    Time 6    Period Months    Status On-going                 Plan - 10/08/19 1355    Clinical Impression Statement LE limb volumetrics reveal R leg volume reduction measures 7.63% since commencing OT for  CDTon 06/17/19. Pt continues to demonstrate slow but steady progress towards all OT  goals for lymphedema management. . Pt tolerated MLD and compression wraps again today to RLE as established. Cont as per POC.    OT Occupational Profile and History Comprehensive Assessment- Review of records and extensive additional review of physical,  cognitive, psychosocial history related to current functional performance    Occupational performance deficits (Please refer to evaluation for details): ADL's;Work;IADL's;Leisure;Rest and Sleep;Social Participation;Other   body image, self esteme   Body Structure / Function / Physical Skills ADL;ROM;Obesity;IADL;Edema;Balance;Decreased knowledge of precautions;Gait;Pain;Skin integrity;Mobility;Decreased knowledge of use of DME    Rehab Potential Good    Clinical Decision Making Several treatment options, min-mod task modification necessary    Comorbidities Affecting Occupational Performance: Presence of comorbidities impacting occupational performance    Comorbidities impacting occupational performance description: lipidema, 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 skin care, lymphatic pumping ther ex, compression with multilayer  , short stretch compression wraps-single leg at a time , knee length. Fit with FLAT KNIT, CUSTOM<, KNEE LENGTH, ccl3 (35-45 mmHg) JOBST ELVAREX CLASSIC ELASTI COMPRESSION STOCKINgs for full time daily use. Consider fitting with [employer], Flexitouch, sequential pneumatic compression device,  or "pump" for optimal LE self management over time at home. The 32-chamber Flexitouch is the only available sequential pneumatic compression device providing proximal to distal lymphatic fluid return via regional lymph nodes (LN) deep abdominal pathways and the thoracic duct to the heart. The basic pneumatic pump is not appropriate for this patient because it provides distal-to-proximal, retrograde massage mobilizing tissue fluid against back pressure which then abruptly ends before reaching regional LNs leaving dense, protein rich fluid at the joint.    Consulted and Agree with Plan of Care Patient           Patient will benefit from skilled therapeutic intervention in order to improve the following deficits and impairments:   Body Structure / Function / Physical Skills: ADL, ROM, Obesity, IADL, Edema, Balance, Decreased knowledge of precautions, Gait, Pain, Skin integrity, Mobility, Decreased knowledge of use of DME       Visit Diagnosis: Lymphedema, not elsewhere classified    Problem List Patient Active Problem List   Diagnosis Date Noted  . Vitamin D deficiency 02/13/2019  . Hyperlipidemia 01/09/2014  . Routine general medical examination at a health care facility 10/07/2013  . Morbid obesity ([place]) 10/07/2013    Andrey Spearman, MS, OTR/L, [facility] 10/08/19 1:58 PM   [place] MAIN [facility] SERVICES [address] [place], [place], [address] Phone: [phone]   Fax:  [phone]  Name: KARISHMA UNREIN MRN: [id number] Date of Birth: [birth year]

## 2019-10-09 ENCOUNTER — Other Ambulatory Visit: Payer: Self-pay

## 2019-10-09 ENCOUNTER — Ambulatory Visit: Payer: BC Managed Care – PPO | Admitting: Occupational Therapy

## 2019-10-09 DIAGNOSIS — I890 Lymphedema, not elsewhere classified: Secondary | ICD-10-CM | POA: Diagnosis not present

## 2019-10-09 NOTE — Therapy (Signed)
[place] MAIN [facility] SERVICES [address] [place], [place], [address] Phone: [phone]   Fax:  [phone]  Occupational Therapy Treatment  Patient Details  Name: Mary Montgomery MRN: [id number] Date of Birth: 1955-11-26 Referring Provider (OT): Annye Asa, MD   Encounter Date: 10/09/2019   OT End of Session - 10/09/19 1240    Visit Number 29    Number of Visits 36    Date for OT Re-Evaluation 12/10/19    OT Start Time 0804    OT Stop Time 0905    OT Time Calculation (min) 61 min    Activity Tolerance Patient tolerated treatment well;No increased pain    Behavior During Therapy WFL for tasks assessed/performed           Past Medical History:  Diagnosis Date   Arthritis    right knee   Bulging disc    Lumber 3/4   Degenerated intervertebral disc    neck   Hay fever    Hyperlipemia    Hypertension    Plantar fasciitis    bilaterl feet    Past Surgical History:  Procedure Laterality Date   CYSTECTOMY  1988   polynidal cyst   WISDOM TOOTH EXTRACTION  1977    There were no vitals filed for this visit.   Subjective Assessment - 10/09/19 0810    Subjective  [name] presents for OT visit 28/36 to address BLE lymphedema. Pt reports R knee pain is decreased after wrapping it yesterday.  Pt requests wrapping above the knee again today.    Pertinent History Dermatologist ID'd lipedema in ~ 11/2018; Recent 30 #weight less, +family hx of leg swelling-father,    Limitations chronic leg swelling and pain, limit functional ambulation and transfers, hx plantar fasciitis, OA, decreased balance    Repetition Increases Symptoms    Special Tests negative Stemmer sign base of toes bilaterally    Patient Stated Goals learn about lymphedema and find out what I can do with it to improve my health                        OT Treatments/Exercises (OP) - 10/09/19 0001      ADLs   ADL Education Given Yes       Manual Therapy   Manual Therapy Edema management;Compression Bandaging;Manual Lymphatic Drainage (MLD)    Edema Management RLE comparative limb volumetrics    Manual Lymphatic Drainage (MLD) MLD to RLE using short neck sequence, deep abdominals, functional inguinals, and sequential strokes to the thigh, leg and foot. Good tolerance.    Compression Bandaging RLE multiple layer , knee length , gradient compression wraps to LLE using 8,10 and 12 cm short stretch wraps over 0.4 cm thick RosiIdal foam. Fourth short stretch added to top edge to extend gradient above knee in an effort to reduce painful   OA pain. Finally added a wide ace wrap to top edge as anchor to hold wrap up.                  OT Education - 10/09/19 1240    Education Details Continued skilled Pt/caregiver education  And LE ADL training throughout visit for lymphedema self care/ home program, including compression wrapping, compression garment and device wear/care, lymphatic pumping ther ex, simple self-MLD, and skin care. Discussed progress towards goals.    Person(s) Educated Patient    Methods Explanation;Demonstration    Comprehension Verbalized understanding;Returned demonstration;Need further  instruction               OT Long Term Goals - 09/11/19 0900      OT LONG TERM GOAL #1   Title Pt will be able to apply BLE, knee length, multi-layer, short stretch compression wraps daily to one leg at a time using correct gradient techniques with modified assistance (extra time)  to achieve optimal limb volume reduction, to return affected limb/s, as closely as possible, to premorbid size and shape, to limit infection risk, and to improve safe functional ambulation and mobility.    Baseline dependent    Time 4    Period Days    Status Achieved      OT LONG TERM GOAL #2   Title Pt will be able to verbalize signs and symptoms of cellulitis infection and identify 4 common lymphedema precautions using printed  resource for reference to limit LE progression over time.    Baseline Max A    Time 4    Period Days    Status Achieved      OT LONG TERM GOAL #3   Title Pt will achieve and sustain no less than  85%  compliance with daily LE self-care home program (skin care, lymphatic pumping therex, compression and simple self-MLD) during Intensive Phase CDT to limit limb swelling, reduce infection risk and limit LE progression.    Baseline Max A    Time 12    Period Weeks    Status Achieved      OT LONG TERM GOAL #4   Title Pt to achieve at least 10% BLE limb volume reduction in BLE below the knees, during Intensive Phase CDT to improve safe functional mobility and ambulation, to improve functional performance of basic and instrumental ADLs, to limit infection risk and chronic LE progression.    Baseline Max A    Time 12    Period Weeks    Status Partially Met   9.54% reduction for LLE  A-D. RLE Rx pending L garment fitg     OT LONG TERM GOAL #5   Title Once issued Pt will be able to don and doff appropriate compression garments and/ or devices using correct techniques and assistive devices with modified independence and extra time for optimal LE management to limit progression over time.    Baseline Max A    Time 12    Period Weeks    Status On-going      OT LONG TERM GOAL #6   Title During self-management phase of CDT Pt will retain limb volume reductions achieved during Intensive Phase CDT with no more than 3% volume increase to limit LE progression and further functional decline.    Baseline Max A    Time 6    Period Months    Status On-going                 Plan - 10/09/19 1241    Clinical Impression Statement Pt tolerated MLD to RLE as established without difficulty today. Wrap above the knee prved to be supportive of arthritic knee and reduced pain, so we extended the length again  to indented area of mid thigh . Pt may benefit frm a suppotive  flexible elastic or neoprene knee  support/ brace one fitted wih compression garments to limit swelling and pain 2/2 inflamation atthe joint. . Cont as per POC.    OT Occupational Profile and History Comprehensive Assessment- Review of records and extensive additional review of  physical, cognitive, psychosocial history related to current functional performance    Occupational performance deficits (Please refer to evaluation for details): ADL's;Work;IADL's;Leisure;Rest and Sleep;Social Participation;Other   body image, self esteme   Body Structure / Function / Physical Skills ADL;ROM;Obesity;IADL;Edema;Balance;Decreased knowledge of precautions;Gait;Pain;Skin integrity;Mobility;Decreased knowledge of use of DME    Rehab Potential Good    Clinical Decision Making Several treatment options, min-mod task modification necessary    Comorbidities Affecting Occupational Performance: Presence of comorbidities impacting occupational performance    Comorbidities impacting occupational performance description: lipidema, 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 skin care, lymphatic pumping ther ex, compression with multilayer  , short stretch compression wraps-single leg at a time , knee length. Fit with FLAT KNIT, CUSTOM<, KNEE LENGTH, ccl3 (35-45 mmHg) JOBST ELVAREX CLASSIC ELASTI COMPRESSION STOCKINgs for full time daily use. Consider fitting with [employer], Flexitouch, sequential pneumatic compression device,  or "pump" for optimal LE self management over time at home. The 32-chamber Flexitouch is the only available sequential pneumatic compression device providing proximal to distal lymphatic fluid return via regional lymph nodes (LN) deep abdominal pathways and the thoracic duct to the heart. The basic pneumatic pump is not appropriate for this patient because it provides distal-to-proximal, retrograde massage mobilizing tissue fluid against back pressure which then abruptly ends before reaching regional LNs leaving dense, protein rich fluid at the joint.    Consulted and Agree with Plan of Care Patient           Patient will benefit from skilled therapeutic intervention in order to improve the following deficits and impairments:   Body Structure / Function / Physical Skills: ADL, ROM, Obesity, IADL, Edema, Balance, Decreased knowledge of precautions, Gait, Pain, Skin integrity, Mobility, Decreased knowledge of use of DME       Visit Diagnosis: Lymphedema, not elsewhere classified    Problem List Patient Active Problem List   Diagnosis Date Noted   Vitamin D deficiency 02/13/2019   Hyperlipidemia 01/09/2014   Routine general medical examination at a health care facility 10/07/2013   Morbid obesity ([place]) 10/07/2013    Andrey Spearman, MS, OTR/L, [facility] 10/09/19 12:48 PM   [place] MAIN [facility] SERVICES [address] [place], [place], [address] Phone: [phone]   Fax:  [phone]  Name: BLOSSIE RAFFEL MRN: [id number] Date of Birth: [birth year]

## 2019-10-21 ENCOUNTER — Ambulatory Visit: Payer: BC Managed Care – PPO | Admitting: Occupational Therapy

## 2019-10-21 ENCOUNTER — Other Ambulatory Visit: Payer: Self-pay

## 2019-10-21 DIAGNOSIS — I890 Lymphedema, not elsewhere classified: Secondary | ICD-10-CM | POA: Diagnosis not present

## 2019-10-21 NOTE — Therapy (Signed)
[place] MAIN [facility] SERVICES [address] [place], [place], [address] Phone: [phone]   Fax:  [phone]  Occupational Therapy Treatment  Patient Details  Name: ALLAINA BROTZMAN MRN: [id number] Date of Birth: [birth year] Referring Provider (OT): Annye Asa, MD   Encounter Date: 10/21/2019   OT End of Session - 10/21/19 1625    Visit Number 30    Number of Visits 36    Date for OT Re-Evaluation 12/10/19    OT Start Time 0100    OT Stop Time 0200    OT Time Calculation (min) 60 min    Activity Tolerance Patient tolerated treatment well;No increased pain    Behavior During Therapy WFL for tasks assessed/performed           Past Medical History:  Diagnosis Date  . Arthritis    right knee  . Bulging disc    Lumber 3/4  . Degenerated intervertebral disc    neck  . Hay fever   . Hyperlipemia   . Hypertension   . Plantar fasciitis    bilaterl feet    Past Surgical History:  Procedure Laterality Date  . CYSTECTOMY  1988   polynidal cyst  . WISDOM TOOTH EXTRACTION  1977    There were no vitals filed for this visit.   Subjective Assessment - 10/21/19 1307    Subjective  THERESE ROCCO presents for OT visit 29/36 to address BLE lymphedema. Pt reports R knee pain is is unchanged. Pt presents wearing L custom knee length compression garment. Pt is happy with this garment to date.    Pertinent History Dermatologist ID'd lipedema in ~ 11/2018; Recent 30 #weight less, +family hx of leg swelling-father,    Limitations chronic leg swelling and pain, limit functional ambulation and transfers, hx plantar fasciitis, OA, decreased balance    Special Tests negative Stemmer sign base of toes bilaterally    Patient Stated Goals learn about lymphedema and find out what I can do with it to improve my health    Currently in Pain? No/denies                        OT Treatments/Exercises (OP) - 10/21/19 0001      ADLs     ADL Education Given Yes (P)       Manual Therapy   Manual Therapy Edema management;Compression Bandaging;Manual Lymphatic Drainage (MLD) (P)     Manual Lymphatic Drainage (MLD) MLD to RLE using short neck sequence, deep abdominals, functional inguinals, and sequential strokes to the thigh, leg and foot. Good tolerance. (P)     Compression Bandaging RLE multiple layer , knee length , gradient compression wraps to LLE using 8,10 and 12 cm short stretch wraps over 0.4 cm thick RosiIdal foam. Fourth short stretch added to top edge to extend gradient above knee in an effort to reduce painful   OA pain. Finally added a wide ace wrap to top edge as anchor to hold wrap up. (P)                   OT Education - 10/21/19 1625    Education Details Continued skilled Pt/caregiver education  And LE ADL training throughout visit for lymphedema self care/ home program, including compression wrapping, compression garment and device wear/care, lymphatic pumping ther ex, simple self-MLD, and skin care. Discussed progress towards goals.    Person(s) Educated Patient    Methods Explanation;Demonstration  Comprehension Verbalized understanding;Returned demonstration;Need further instruction               OT Long Term Goals - 10/21/19 1600      OT LONG TERM GOAL #1   Title Pt will be able to apply BLE, knee length, multi-layer, short stretch compression wraps daily to one leg at a time using correct gradient techniques with modified assistance (extra time)  to achieve optimal limb volume reduction, to return affected limb/s, as closely as possible, to premorbid size and shape, to limit infection risk, and to improve safe functional ambulation and mobility.    Baseline dependent    Time 4    Period Days    Status Achieved      OT LONG TERM GOAL #2   Title Pt will be able to verbalize signs and symptoms of cellulitis infection and identify 4 common lymphedema precautions using printed resource for  reference to limit LE progression over time.    Baseline Max A    Time 4    Period Days    Status Achieved      OT LONG TERM GOAL #3   Title Pt will achieve and sustain no less than  85%  compliance with daily LE self-care home program (skin care, lymphatic pumping therex, compression and simple self-MLD) during Intensive Phase CDT to limit limb swelling, reduce infection risk and limit LE progression.    Baseline Max A    Time 12    Period Weeks    Status Achieved      OT LONG TERM GOAL #4   Title Pt to achieve at least 10% BLE limb volume reduction in BLE below the knees, during Intensive Phase CDT to improve safe functional mobility and ambulation, to improve functional performance of basic and instrumental ADLs, to limit infection risk and chronic LE progression.    Baseline Max A    Time 12    Period Weeks    Status Partially Met   9.54% reduction for LLE  A-D. RLE volumetrics TBA next visit     OT LONG TERM GOAL #5   Title Once issued Pt will be able to don and doff appropriate compression garments and/ or devices using correct techniques and assistive devices with modified independence and extra time for optimal LE management to limit progression over time.    Baseline Max A    Time 12    Period Weeks    Status Partially Met   Met for LLE     OT LONG TERM GOAL #6   Title During self-management phase of CDT Pt will retain limb volume reductions achieved during Intensive Phase CDT with no more than 3% volume increase to limit LE progression and further functional decline.    Baseline Max A    Time 6    Period Months    Status On-going                 Plan - 10/21/19 1628    Clinical Impression Statement Pt demonstrates excellent progress towards all lymphedema self-care goals. She has mastered compression wrapping and she is diligently compliant with wraps between visits. She is able to don and doff aLLE custom compression stocking and wears it as directed. Pt  tolerated MLD and compression wraps to RLE without increased pain. LE related pain in both legs is decreased despite ongoing OA knee pain. Plan for next visit is to complete RLE limb volumetrics. If limb volume is withing goal range (  10% or better), or if close, we'll complete custom garment measurements for the 2nd limb.    OT Occupational Profile and History Comprehensive Assessment- Review of records and extensive additional review of physical, cognitive, psychosocial history related to current functional performance    Occupational performance deficits (Please refer to evaluation for details): ADL's;Work;IADL's;Leisure;Rest and Sleep;Social Participation;Other   body image, self esteme   Body Structure / Function / Physical Skills ADL;ROM;Obesity;IADL;Edema;Balance;Decreased knowledge of precautions;Gait;Pain;Skin integrity;Mobility;Decreased knowledge of use of DME    Rehab Potential Good    Clinical Decision Making Several treatment options, min-mod task modification necessary    Comorbidities Affecting Occupational Performance: Presence of comorbidities impacting occupational performance    Comorbidities impacting occupational performance description: lipidema, 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 skin care, lymphatic pumping ther ex, compression with multilayer  , short stretch compression wraps-single leg at a time , knee length. Fit with FLAT KNIT, CUSTOM<,  KNEE LENGTH, ccl3 (35-45 mmHg) JOBST ELVAREX CLASSIC ELASTI COMPRESSION STOCKINgs for full time daily use. Consider fitting with [employer], Flexitouch, sequential pneumatic compression device, or "pump" for optimal LE self management over time at home. The 32-chamber Flexitouch is the only available sequential pneumatic compression device providing proximal to distal lymphatic fluid return via regional lymph nodes (LN) deep abdominal pathways and the thoracic duct to the heart. The basic pneumatic pump is not appropriate for this patient because it provides distal-to-proximal, retrograde massage mobilizing tissue fluid against back pressure which then abruptly ends before reaching regional LNs leaving dense, protein rich fluid at the joint.    Consulted and Agree with Plan of Care Patient           Patient will benefit from skilled therapeutic intervention in order to improve the following deficits and impairments:   Body Structure / Function / Physical Skills: ADL, ROM, Obesity, IADL, Edema, Balance, Decreased knowledge of precautions, Gait, Pain, Skin integrity, Mobility, Decreased knowledge of use of DME       Visit Diagnosis: Lymphedema, not elsewhere classified    Problem List Patient Active Problem List   Diagnosis Date Noted  . Vitamin D deficiency 02/13/2019  . Hyperlipidemia 01/09/2014  . Routine general medical examination at a health care facility 10/07/2013  . Morbid obesity ([place]) 10/07/2013    Andrey Spearman, MS, OTR/L, [facility] 10/21/19 4:32 PM  [place] MAIN [facility] SERVICES [address] [place], [place], [address] Phone: [phone]   Fax:  [phone]  Name: QUANASIA DEFINO MRN: [id number] Date of Birth: 02-Oct-1955

## 2019-10-28 ENCOUNTER — Other Ambulatory Visit: Payer: Self-pay

## 2019-10-28 ENCOUNTER — Ambulatory Visit: Payer: BC Managed Care – PPO | Admitting: Occupational Therapy

## 2019-10-28 DIAGNOSIS — I890 Lymphedema, not elsewhere classified: Secondary | ICD-10-CM

## 2019-10-28 NOTE — Therapy (Signed)
[place] MAIN [facility] SERVICES [address] [place], [place], [address] Phone: [phone]   Fax:  [phone]  Occupational Therapy Treatment  Patient Details  Name: Mary Montgomery MRN: [id number] Date of Birth: 15-Dec-1955 Referring Provider (OT): Annye Asa, MD   Encounter Date: 10/28/2019   OT End of Session - 10/28/19 4315    Visit Number 31    Number of Visits 36    Date for OT Re-Evaluation 12/10/19    OT Start Time 0101    OT Stop Time 0210    OT Time Calculation (min) 69 min    Activity Tolerance Patient tolerated treatment well;No increased pain    Behavior During Therapy WFL for tasks assessed/performed           Past Medical History:  Diagnosis Date  . Arthritis    right knee  . Bulging disc    Lumber 3/4  . Degenerated intervertebral disc    neck  . Hay fever   . Hyperlipemia   . Hypertension   . Plantar fasciitis    bilaterl feet    Past Surgical History:  Procedure Laterality Date  . CYSTECTOMY  1988   polynidal cyst  . WISDOM TOOTH EXTRACTION  1977    There were no vitals filed for this visit.   Subjective Assessment - 10/28/19 1628    Subjective  Mary Montgomery presents for OT visit 30/36 to address BLE lymphedema. Pt has no new complaints today. She continues to spend time exercising in her home swimming pool nearly daily by report.    Pertinent History Dermatologist ID'd lipedema in ~ 11/2018; Recent 30 #weight less, +family hx of leg swelling-father,    Limitations chronic leg swelling and pain, limit functional ambulation and transfers, hx plantar fasciitis, OA, decreased balance    Special Tests negative Stemmer sign base of toes bilaterally    Patient Stated Goals learn about lymphedema and find out what I can do with it to improve my health               LYMPHEDEMA/ONCOLOGY QUESTIONNAIRE - 10/28/19 0001      Right Lower Extremity Lymphedema   Other RLE ankle to tibial  tuberosity (A-D) limb volume = 6346.0 ml.    Other R leg volume (ankle to tibial tuberosity (A-))  reduction measures 1.38% since last measured on 7/6 and measures 8.92% overall since commencing CDT                    OT Treatments/Exercises (OP) - 10/28/19 0001      ADLs   ADL Education Given Yes      Manual Therapy   Manual Therapy Edema management;Compression Bandaging;Manual Lymphatic Drainage (MLD)    Manual therapy comments RLE comparative limb volumetrics    Edema Management RLE anatomical measurements for custom knee high compression garments.    Compression Bandaging RLE multiple layer , knee length , gradient compression wraps to LLE using 8,10 and 12 cm short stretch wraps over 0.4 cm thick RosiIdal foam. Fourth short stretch added to top edge to extend gradient above knee in an effort to reduce painful   OA pain. Finally added a wide ace wrap to top edge as anchor to hold wrap up.                  OT Education - 10/28/19 1632    Education Details Continued skilled Pt/caregiver education  And LE ADL training throughout  visit for lymphedema self care/ home program, including compression wrapping, compression garment and device wear/care, lymphatic pumping ther ex, simple self-MLD, and skin care. Discussed progress towards goals.    Person(s) Educated Patient    Methods Explanation;Demonstration    Comprehension Verbalized understanding;Returned demonstration;Need further instruction               OT Long Term Goals - 10/21/19 1600      OT LONG TERM GOAL #1   Title Pt will be able to apply BLE, knee length, multi-layer, short stretch compression wraps daily to one leg at a time using correct gradient techniques with modified assistance (extra time)  to achieve optimal limb volume reduction, to return affected limb/s, as closely as possible, to premorbid size and shape, to limit infection risk, and to improve safe functional ambulation and mobility.     Baseline dependent    Time 4    Period Days    Status Achieved      OT LONG TERM GOAL #2   Title Pt will be able to verbalize signs and symptoms of cellulitis infection and identify 4 common lymphedema precautions using printed resource for reference to limit LE progression over time.    Baseline Max A    Time 4    Period Days    Status Achieved      OT LONG TERM GOAL #3   Title Pt will achieve and sustain no less than  85%  compliance with daily LE self-care home program (skin care, lymphatic pumping therex, compression and simple self-MLD) during Intensive Phase CDT to limit limb swelling, reduce infection risk and limit LE progression.    Baseline Max A    Time 12    Period Weeks    Status Achieved      OT LONG TERM GOAL #4   Title Pt to achieve at least 10% BLE limb volume reduction in BLE below the knees, during Intensive Phase CDT to improve safe functional mobility and ambulation, to improve functional performance of basic and instrumental ADLs, to limit infection risk and chronic LE progression.    Baseline Max A    Time 12    Period Weeks    Status Partially Met   9.54% reduction for LLE  A-D. RLE volumetrics TBA next visit     OT LONG TERM GOAL #5   Title Once issued Pt will be able to don and doff appropriate compression garments and/ or devices using correct techniques and assistive devices with modified independence and extra time for optimal LE management to limit progression over time.    Baseline Max A    Time 12    Period Weeks    Status Partially Met   Met for LLE     OT LONG TERM GOAL #6   Title During self-management phase of CDT Pt will retain limb volume reductions achieved during Intensive Phase CDT with no more than 3% volume increase to limit LE progression and further functional decline.    Baseline Max A    Time 6    Period Months    Status On-going                 Plan - 10/28/19 1633    Clinical Impression Statement R leg volume (ankle to  tibial tuberosity (A-))  reduction measures 1.38% since last measured on 7/6 and measures 8.92% overall since commencing CDT. Although this value for overall reduction below the knee does not meet 10% volume  reduction goal , it closely approaches it and is a significant achievement for any patient presenting with lipo-lymphedema. Completed anatomical measurements for custom RLE comrpession garment matching specifications of LE garment. Faxed to vendor after session. Fit ASAP and consider decreasing Rx frequency as Pt transitions to self management phase of CDT.    OT Occupational Profile and History Comprehensive Assessment- Review of records and extensive additional review of physical, cognitive, psychosocial history related to current functional performance    Occupational performance deficits (Please refer to evaluation for details): ADL's;Work;IADL's;Leisure;Rest and Sleep;Social Participation;Other   body image, self esteme   Body Structure / Function / Physical Skills ADL;ROM;Obesity;IADL;Edema;Balance;Decreased knowledge of precautions;Gait;Pain;Skin integrity;Mobility;Decreased knowledge of use of DME    Rehab Potential Good    Clinical Decision Making Several treatment options, min-mod task modification necessary    Comorbidities Affecting Occupational Performance: Presence of comorbidities impacting occupational performance    Comorbidities impacting occupational performance description: lipidema, 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 skin care, lymphatic pumping ther ex, compression with multilayer  , short stretch compression wraps-single leg at a time , knee length. Fit with FLAT KNIT, CUSTOM<, KNEE LENGTH, ccl3 (35-45 mmHg) JOBST ELVAREX CLASSIC ELASTI COMPRESSION STOCKINgs for full time daily use. Consider fitting with [employer], Flexitouch, sequential pneumatic compression device, or "pump" for optimal LE self management over time at home. The 32-chamber Flexitouch is the only available sequential pneumatic compression device providing proximal to distal lymphatic fluid return via regional lymph nodes (LN) deep abdominal pathways and the thoracic duct to the heart. The basic pneumatic pump is not appropriate for this patient because it provides distal-to-proximal, retrograde massage mobilizing tissue fluid against back pressure which then abruptly ends before reaching regional LNs leaving dense, protein rich fluid at the joint.    Consulted and Agree with Plan of Care Patient           Patient will benefit from skilled therapeutic intervention in order to improve the following deficits and impairments:   Body Structure / Function / Physical Skills: ADL, ROM, Obesity, IADL, Edema, Balance, Decreased knowledge of precautions, Gait, Pain, Skin integrity, Mobility, Decreased knowledge of use of DME       Visit Diagnosis: Lymphedema, not elsewhere classified    Problem List Patient Active Problem List   Diagnosis Date Noted  . Vitamin D deficiency 02/13/2019  . Hyperlipidemia 01/09/2014  . Routine general medical examination at a health care facility 10/07/2013  . Morbid obesity ([place]) 10/07/2013    Andrey Spearman, MS, OTR/L, [facility] 10/28/19 4:38 PM  [place] MAIN [facility] SERVICES [address] [place], [place], [address] Phone: [phone]   Fax:  [phone]  Name: Mary Montgomery MRN: [id number] Date of Birth: 1955-05-30

## 2019-11-01 ENCOUNTER — Other Ambulatory Visit: Payer: Self-pay

## 2019-11-01 ENCOUNTER — Ambulatory Visit: Payer: BC Managed Care – PPO | Admitting: Occupational Therapy

## 2019-11-01 DIAGNOSIS — I890 Lymphedema, not elsewhere classified: Secondary | ICD-10-CM | POA: Diagnosis not present

## 2019-11-01 NOTE — Therapy (Signed)
[place] MAIN [facility] SERVICES [address] [place], [place], [address] Phone: [phone]   Fax:  [phone]  Occupational Therapy Treatment  Patient Details  Name: KATALEYAH CARDUCCI MRN: [id number] Date of Birth: 1955-10-30 Referring Provider (OT): Annye Asa, MD   Encounter Date: 11/01/2019   OT End of Session - 11/01/19 0944    Visit Number 32    Number of Visits 36    Date for OT Re-Evaluation 12/10/19    OT Start Time 0844    OT Stop Time 0944    OT Time Calculation (min) 60 min    Activity Tolerance Patient tolerated treatment well;No increased pain    Behavior During Therapy WFL for tasks assessed/performed           Past Medical History:  Diagnosis Date  . Arthritis    right knee  . Bulging disc    Lumber 3/4  . Degenerated intervertebral disc    neck  . Hay fever   . Hyperlipemia   . Hypertension   . Plantar fasciitis    bilaterl feet    Past Surgical History:  Procedure Laterality Date  . CYSTECTOMY  1988   polynidal cyst  . WISDOM TOOTH EXTRACTION  1977    There were no vitals filed for this visit.   Subjective Assessment - 11/01/19 0857    Subjective  Mary Montgomery presents for OT visit 31/36 to address BLE lymphedema. Pt denies leg pain this morning. She has no new complaints or LE related concerns.    Pertinent History Dermatologist ID'd lipedema in ~ 11/2018; Recent 30 #weight less, +family hx of leg swelling-father,    Limitations chronic leg swelling and pain, limit functional ambulation and transfers, hx plantar fasciitis, OA, decreased balance    Special Tests negative Stemmer sign base of toes bilaterally    Patient Stated Goals learn about lymphedema and find out what I can do with it to improve my health                        OT Treatments/Exercises (OP) - 11/01/19 0001      ADLs   ADL Education Given Yes      Manual Therapy   Manual Therapy Edema management;Manual  Lymphatic Drainage (MLD);Compression Bandaging                  OT Education - 11/01/19 0858    Education Details Continued skilled Pt/caregiver education  And LE ADL training throughout visit for lymphedema self care/ home program, including compression wrapping, compression garment and device wear/care, lymphatic pumping ther ex, simple self-MLD, and skin care. Discussed progress towards goals.    Person(s) Educated Patient    Methods Explanation;Demonstration    Comprehension Verbalized understanding;Returned demonstration               OT Long Term Goals - 10/21/19 1600      OT LONG TERM GOAL #1   Title Pt will be able to apply BLE, knee length, multi-layer, short stretch compression wraps daily to one leg at a time using correct gradient techniques with modified assistance (extra time)  to achieve optimal limb volume reduction, to return affected limb/s, as closely as possible, to premorbid size and shape, to limit infection risk, and to improve safe functional ambulation and mobility.    Baseline dependent    Time 4    Period Days    Status Achieved  OT LONG TERM GOAL #2   Title Pt will be able to verbalize signs and symptoms of cellulitis infection and identify 4 common lymphedema precautions using printed resource for reference to limit LE progression over time.    Baseline Max A    Time 4    Period Days    Status Achieved      OT LONG TERM GOAL #3   Title Pt will achieve and sustain no less than  85%  compliance with daily LE self-care home program (skin care, lymphatic pumping therex, compression and simple self-MLD) during Intensive Phase CDT to limit limb swelling, reduce infection risk and limit LE progression.    Baseline Max A    Time 12    Period Weeks    Status Achieved      OT LONG TERM GOAL #4   Title Pt to achieve at least 10% BLE limb volume reduction in BLE below the knees, during Intensive Phase CDT to improve safe functional mobility and  ambulation, to improve functional performance of basic and instrumental ADLs, to limit infection risk and chronic LE progression.    Baseline Max A    Time 12    Period Weeks    Status Partially Met   9.54% reduction for LLE  A-D. RLE volumetrics TBA next visit     OT LONG TERM GOAL #5   Title Once issued Pt will be able to don and doff appropriate compression garments and/ or devices using correct techniques and assistive devices with modified independence and extra time for optimal LE management to limit progression over time.    Baseline Max A    Time 12    Period Weeks    Status Partially Met   Met for LLE     OT LONG TERM GOAL #6   Title During self-management phase of CDT Pt will retain limb volume reductions achieved during Intensive Phase CDT with no more than 3% volume increase to limit LE progression and further functional decline.    Baseline Max A    Time 6    Period Months    Status On-going                 Plan - 11/01/19 0944    Clinical Impression Statement Pt tolerated manual therapy and compression wraps to RLE without increased pain or other difficulty today. Pt continues to be 100% compliant with all home program self-care components. Cont as per POC.    OT Occupational Profile and History Comprehensive Assessment- Review of records and extensive additional review of physical, cognitive, psychosocial history related to current functional performance    Occupational performance deficits (Please refer to evaluation for details): ADL's;Work;IADL's;Leisure;Rest and Sleep;Social Participation;Other   body image, self esteme   Body Structure / Function / Physical Skills ADL;ROM;Obesity;IADL;Edema;Balance;Decreased knowledge of precautions;Gait;Pain;Skin integrity;Mobility;Decreased knowledge of use of DME    Rehab Potential Good    Clinical Decision Making Several treatment options, min-mod task modification necessary    Comorbidities Affecting Occupational  Performance: Presence of comorbidities impacting occupational performance    Comorbidities impacting occupational performance description: lipidema, 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 skin care, lymphatic pumping ther ex, compression with multilayer  , short stretch compression wraps-single leg at a time , knee length. Fit with FLAT KNIT, CUSTOM<, KNEE LENGTH, ccl3 (35-45 mmHg) JOBST ELVAREX CLASSIC ELASTI COMPRESSION STOCKINgs for full time daily use. Consider fitting with [employer], Flexitouch, sequential pneumatic compression device, or "pump" for optimal LE self management over time at home. The 32-chamber Flexitouch is the only available sequential pneumatic compression device providing proximal to distal lymphatic fluid return via regional lymph nodes (LN) deep abdominal pathways and the thoracic duct to the heart. The basic pneumatic pump is not appropriate for this patient because it provides distal-to-proximal, retrograde massage mobilizing tissue fluid against back pressure which then abruptly ends before reaching regional LNs leaving dense, protein rich fluid at the joint.    Consulted and Agree with Plan of Care Patient           Patient will benefit from skilled therapeutic intervention in order to improve the following deficits and impairments:   Body Structure / Function / Physical Skills: ADL, ROM, Obesity, IADL, Edema, Balance, Decreased  knowledge of precautions, Gait, Pain, Skin integrity, Mobility, Decreased knowledge of use of DME       Visit Diagnosis: Lymphedema, not elsewhere classified    Problem List Patient Active Problem List   Diagnosis Date Noted  . Vitamin D deficiency 02/13/2019  . Hyperlipidemia 01/09/2014  . Routine general medical examination at a health care facility 10/07/2013  . Morbid obesity ([place]) 10/07/2013   Andrey Spearman, MS, OTR/L, [facility] 11/01/19 9:46 AM   [place] MAIN [facility] SERVICES [address] [place], [place], [address] Phone: [phone]   Fax:  [phone]  Name: GILA LAUF MRN: [id number] Date of Birth: Sep 04, 1955

## 2019-11-04 ENCOUNTER — Ambulatory Visit: Payer: BC Managed Care – PPO | Attending: Family Medicine | Admitting: Occupational Therapy

## 2019-11-04 ENCOUNTER — Other Ambulatory Visit: Payer: Self-pay

## 2019-11-04 DIAGNOSIS — I890 Lymphedema, not elsewhere classified: Secondary | ICD-10-CM | POA: Diagnosis not present

## 2019-11-04 NOTE — Therapy (Signed)
[place] MAIN [facility] SERVICES [address] [place], [place], [address] Phone: [phone]   Fax:  [phone]  Occupational Therapy Treatment  Patient Details  Name: Mary Montgomery MRN: [id number] Date of Birth: Feb 06, 1956 Referring Provider (OT): Annye Asa, MD   Encounter Date: 11/04/2019   OT End of Session - 11/04/19 0905    Visit Number 33    Number of Visits 36    Date for OT Re-Evaluation 12/10/19    OT Start Time 0804    OT Stop Time 0900    OT Time Calculation (min) 56 min    Activity Tolerance Patient tolerated treatment well;No increased pain    Behavior During Therapy WFL for tasks assessed/performed           Past Medical History:  Diagnosis Date  . Arthritis    right knee  . Bulging disc    Lumber 3/4  . Degenerated intervertebral disc    neck  . Hay fever   . Hyperlipemia   . Hypertension   . Plantar fasciitis    bilaterl feet    Past Surgical History:  Procedure Laterality Date  . CYSTECTOMY  1988   polynidal cyst  . WISDOM TOOTH EXTRACTION  1977    There were no vitals filed for this visit.   Subjective Assessment - 11/04/19 8250    Subjective  Mary Montgomery presents for OT visit 33/36 to address BLE lymphedema. Pt denies leg pain this morning. She has no new complaints or LE related concerns.    Pertinent History Dermatologist ID'd lipedema in ~ 11/2018; Recent 30 #weight less, +family hx of leg swelling-father,    Limitations chronic leg swelling and pain, limit functional ambulation and transfers, hx plantar fasciitis, OA, decreased balance    Special Tests negative Stemmer sign base of toes bilaterally    Patient Stated Goals learn about lymphedema and find out what I can do with it to improve my health                        OT Treatments/Exercises (OP) - 11/04/19 0001      ADLs   ADL Education Given Yes      Manual Therapy   Manual Therapy Edema management;Manual  Lymphatic Drainage (MLD);Compression Bandaging    Manual Lymphatic Drainage (MLD) MLD to RLE using short neck sequence, deep abdominals, functional inguinals, and sequential strokes to the thigh, leg and foot. Good tolerance.    Compression Bandaging RLE multiple layer , knee length , gradient compression wraps to LLE using 8,10 and 12 cm short stretch wraps over 0.4 cm thick RosiIdal foam. Fourth short stretch added to top edge to extend gradient above knee in an effort to reduce painful   OA pain. Finally added a wide ace wrap to top edge as anchor to hold wrap up.                  OT Education - 11/04/19 0904    Education Details Continued skilled Pt/caregiver education  And LE ADL training throughout visit for lymphedema self care/ home program, including compression wrapping, compression garment and device wear/care, lymphatic pumping ther ex, simple self-MLD, and skin care. Discussed progress towards goals.    Person(s) Educated Patient    Methods Explanation;Demonstration    Comprehension Verbalized understanding;Returned demonstration               OT Long Term Goals - 10/21/19 1600  OT LONG TERM GOAL #1   Title Pt will be able to apply BLE, knee length, multi-layer, short stretch compression wraps daily to one leg at a time using correct gradient techniques with modified assistance (extra time)  to achieve optimal limb volume reduction, to return affected limb/s, as closely as possible, to premorbid size and shape, to limit infection risk, and to improve safe functional ambulation and mobility.    Baseline dependent    Time 4    Period Days    Status Achieved      OT LONG TERM GOAL #2   Title Pt will be able to verbalize signs and symptoms of cellulitis infection and identify 4 common lymphedema precautions using printed resource for reference to limit LE progression over time.    Baseline Max A    Time 4    Period Days    Status Achieved      OT LONG TERM GOAL  #3   Title Pt will achieve and sustain no less than  85%  compliance with daily LE self-care home program (skin care, lymphatic pumping therex, compression and simple self-MLD) during Intensive Phase CDT to limit limb swelling, reduce infection risk and limit LE progression.    Baseline Max A    Time 12    Period Weeks    Status Achieved      OT LONG TERM GOAL #4   Title Pt to achieve at least 10% BLE limb volume reduction in BLE below the knees, during Intensive Phase CDT to improve safe functional mobility and ambulation, to improve functional performance of basic and instrumental ADLs, to limit infection risk and chronic LE progression.    Baseline Max A    Time 12    Period Weeks    Status Partially Met   9.54% reduction for LLE  A-D. RLE volumetrics TBA next visit     OT LONG TERM GOAL #5   Title Once issued Pt will be able to don and doff appropriate compression garments and/ or devices using correct techniques and assistive devices with modified independence and extra time for optimal LE management to limit progression over time.    Baseline Max A    Time 12    Period Weeks    Status Partially Met   Met for LLE     OT LONG TERM GOAL #6   Title During self-management phase of CDT Pt will retain limb volume reductions achieved during Intensive Phase CDT with no more than 3% volume increase to limit LE progression and further functional decline.    Baseline Max A    Time 6    Period Months    Status On-going                 Plan - 11/04/19 7026    Clinical Impression Statement Emphasis of visit on MLD to RLE/RLQ as established. Application of knee length, multilayer, short stretch compression wraps. Pt  continues to perform all LE self care protocols daily with excellent compliance. Limb volume is slow, but visible and palpable, as is typical with lipo-lymphedema. Cont as per POC.    OT Occupational Profile and History Comprehensive Assessment- Review of records and  extensive additional review of physical, cognitive, psychosocial history related to current functional performance    Occupational performance deficits (Please refer to evaluation for details): ADL's;Work;IADL's;Leisure;Rest and Sleep;Social Participation;Other   body image, self esteme   Body Structure / Function / Physical Skills ADL;ROM;Obesity;IADL;Edema;Balance;Decreased knowledge of precautions;Gait;Pain;Skin integrity;Mobility;Decreased  knowledge of use of DME    Rehab Potential Good    Clinical Decision Making Several treatment options, min-mod task modification necessary    Comorbidities Affecting Occupational Performance: Presence of comorbidities impacting occupational performance    Comorbidities impacting occupational performance description: lipidema, 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 skin care, lymphatic pumping ther ex, compression with multilayer  , short stretch compression wraps-single leg at a time , knee length. Fit with FLAT KNIT, CUSTOM<, KNEE LENGTH, ccl3 (35-45 mmHg) JOBST ELVAREX CLASSIC ELASTI COMPRESSION STOCKINgs for full time daily use. Consider fitting with [employer], Flexitouch, sequential pneumatic compression device, or "pump" for optimal LE self management over time at home. The 32-chamber Flexitouch is the only available sequential pneumatic compression device providing proximal to distal lymphatic fluid return via regional  lymph nodes (LN) deep abdominal pathways and the thoracic duct to the heart. The basic pneumatic pump is not appropriate for this patient because it provides distal-to-proximal, retrograde massage mobilizing tissue fluid against back pressure which then abruptly ends before reaching regional LNs leaving dense, protein rich fluid at the joint.    Consulted and Agree with Plan of Care Patient           Patient will benefit from skilled therapeutic intervention in order to improve the following deficits and impairments:   Body Structure / Function / Physical Skills: ADL, ROM, Obesity, IADL, Edema, Balance, Decreased knowledge of precautions, Gait, Pain, Skin integrity, Mobility, Decreased knowledge of use of DME       Visit Diagnosis: Lymphedema, not elsewhere classified    Problem List Patient Active Problem List   Diagnosis Date Noted  . Vitamin D deficiency 02/13/2019  . Hyperlipidemia 01/09/2014  . Routine general medical examination at a health care facility 10/07/2013  . Morbid obesity ([place]) 10/07/2013   Andrey Spearman, MS, OTR/L, [facility] 11/04/19 9:06 AM   [place] MAIN [facility] SERVICES [address] [place], [place], [address] Phone: [phone]   Fax:  [phone]  Name: Mary Montgomery MRN: [id number] Date of Birth: Jun 08, 1955

## 2019-11-06 ENCOUNTER — Ambulatory Visit: Payer: BC Managed Care – PPO | Admitting: Occupational Therapy

## 2019-11-22 ENCOUNTER — Other Ambulatory Visit: Payer: Self-pay

## 2019-11-22 ENCOUNTER — Ambulatory Visit: Payer: BC Managed Care – PPO | Admitting: Occupational Therapy

## 2019-11-22 DIAGNOSIS — I890 Lymphedema, not elsewhere classified: Secondary | ICD-10-CM

## 2019-11-22 NOTE — Therapy (Signed)
[place] MAIN [facility] SERVICES [address] [place], [place], [address] Phone: [phone]   Fax:  [phone]  Occupational Therapy Treatment  Patient Details  Name: Mary Montgomery MRN: [id number] Date of Birth: Aug 10, 1955 Referring Provider (OT): Annye Asa, MD   Encounter Date: 11/22/2019   OT End of Session - 11/22/19 1010    Visit Number 34    Number of Visits 36    Date for OT Re-Evaluation 12/10/19    OT Start Time 0800    OT Stop Time 0846    OT Time Calculation (min) 46 min    Activity Tolerance Patient tolerated treatment well;No increased pain    Behavior During Therapy WFL for tasks assessed/performed           Past Medical History:  Diagnosis Date  . Arthritis    right knee  . Bulging disc    Lumber 3/4  . Degenerated intervertebral disc    neck  . Hay fever   . Hyperlipemia   . Hypertension   . Plantar fasciitis    bilaterl feet    Past Surgical History:  Procedure Laterality Date  . CYSTECTOMY  1988   polynidal cyst  . WISDOM TOOTH EXTRACTION  1977    There were no vitals filed for this visit.   Subjective Assessment - 11/22/19 1007    Subjective  Mary Montgomery presents for OT visit 34/36 to address BLE lymphedema. Pt  brings new RLE custom garment for assessment. Pt c/o top band irritating small toe, overall length too long and stocking being somewhat more taught than the L.    Pertinent History Dermatologist ID'd lipedema in ~ 11/2018; Recent 30 #weight less, +family hx of leg swelling-father,    Limitations chronic leg swelling and pain, limit functional ambulation and transfers, hx plantar fasciitis, OA, decreased balance    Special Tests negative Stemmer sign base of toes bilaterally    Patient Stated Goals learn about lymphedema and find out what I can do with it to improve my health                        OT Treatments/Exercises (OP) - 11/22/19 0001      ADLs   ADL  Education Given Yes      Manual Therapy   Manual Therapy Edema management;Manual Lymphatic Drainage (MLD);Compression Bandaging    Edema Management repeated anatomical measurements for RLE customc compression stocking to fine tune fit    Manual Lymphatic Drainage (MLD) MLD to RLE using short neck sequence, deep abdominals, functional inguinals, and sequential strokes to the thigh, leg and foot. Good tolerance.                  OT Education - 11/22/19 1009    Education Details Continued skilled Pt/caregiver education  And LE ADL training throughout visit for lymphedema self care/ home program, including compression wrapping, compression garment and device wear/care, lymphatic pumping ther ex, simple self-MLD, and skin care. Discussed progress towards goals.    Person(s) Educated Patient    Methods Explanation;Demonstration    Comprehension Verbalized understanding;Returned demonstration               OT Long Term Goals - 10/21/19 1600      OT LONG TERM GOAL #1   Title Pt will be able to apply BLE, knee length, multi-layer, short stretch compression wraps daily to one leg at a time using correct gradient  techniques with modified assistance (extra time)  to achieve optimal limb volume reduction, to return affected limb/s, as closely as possible, to premorbid size and shape, to limit infection risk, and to improve safe functional ambulation and mobility.    Baseline dependent    Time 4    Period Days    Status Achieved      OT LONG TERM GOAL #2   Title Pt will be able to verbalize signs and symptoms of cellulitis infection and identify 4 common lymphedema precautions using printed resource for reference to limit LE progression over time.    Baseline Max A    Time 4    Period Days    Status Achieved      OT LONG TERM GOAL #3   Title Pt will achieve and sustain no less than  85%  compliance with daily LE self-care home program (skin care, lymphatic pumping therex, compression  and simple self-MLD) during Intensive Phase CDT to limit limb swelling, reduce infection risk and limit LE progression.    Baseline Max A    Time 12    Period Weeks    Status Achieved      OT LONG TERM GOAL #4   Title Pt to achieve at least 10% BLE limb volume reduction in BLE below the knees, during Intensive Phase CDT to improve safe functional mobility and ambulation, to improve functional performance of basic and instrumental ADLs, to limit infection risk and chronic LE progression.    Baseline Max A    Time 12    Period Weeks    Status Partially Met   9.54% reduction for LLE  A-D. RLE volumetrics TBA next visit     OT LONG TERM GOAL #5   Title Once issued Pt will be able to don and doff appropriate compression garments and/ or devices using correct techniques and assistive devices with modified independence and extra time for optimal LE management to limit progression over time.    Baseline Max A    Time 12    Period Weeks    Status Partially Met   Met for LLE     OT LONG TERM GOAL #6   Title During self-management phase of CDT Pt will retain limb volume reductions achieved during Intensive Phase CDT with no more than 3% volume increase to limit LE progression and further functional decline.    Baseline Max A    Time 6    Period Months    Status On-going                 Plan - 11/22/19 1010    Clinical Impression Statement repeated anatomical measurements for RLE customc compression stocking to fine tune fit. Pt tolerated abbreviated MLD to RLE. Once garment "remake" is deivered Pt will contact therapist with assessment of fit and function.    OT Occupational Profile and History Comprehensive Assessment- Review of records and extensive additional review of physical, cognitive, psychosocial history related to current functional performance    Occupational performance deficits (Please refer to evaluation for details): ADL's;Work;IADL's;Leisure;Rest and Sleep;Social  Participation;Other   body image, self esteme   Body Structure / Function / Physical Skills ADL;ROM;Obesity;IADL;Edema;Balance;Decreased knowledge of precautions;Gait;Pain;Skin integrity;Mobility;Decreased knowledge of use of DME    Rehab Potential Good    Clinical Decision Making Several treatment options, min-mod task modification necessary    Comorbidities Affecting Occupational Performance: Presence of comorbidities impacting occupational performance    Comorbidities impacting occupational performance description: lipidema, suspected CVI  Modification or Assistance to Complete Evaluation  Min-Moderate modification of tasks or assist with assess necessary to complete eval    OT Frequency 2x / week    OT Duration 12 weeks    OT Treatment/Interventions Self-care/ADL training;Therapeutic exercise;Functional Mobility Training;Manual lymph drainage;Therapeutic activities;Other (comment);Compression bandaging;Manual Therapy;DME and/or AE instruction   skin care with low ph Raoul Pitch (castor oil) and compression garment / device measurement and fitting   Plan CDT: manual lymphatic drainage (MLD) . skin care, lymphatic pumping ther ex, compression with multilayer  , short stretch compression wraps-single leg at a time , knee length. Fit with FLAT KNIT, CUSTOM<, KNEE LENGTH, ccl3 (35-45 mmHg) JOBST ELVAREX CLASSIC ELASTI COMPRESSION STOCKINgs for full time daily use. Consider fitting with [employer], Flexitouch, sequential pneumatic compression device, or "pump" for optimal LE self management over time at home. The 32-chamber Flexitouch is the only available sequential pneumatic compression device providing proximal to distal lymphatic fluid return via regional lymph nodes (LN) deep abdominal pathways and the thoracic duct to the heart. The basic pneumatic pump is not appropriate for this patient because it provides distal-to-proximal, retrograde massage mobilizing tissue fluid against back pressure  which then abruptly ends before reaching regional LNs leaving dense, protein rich fluid at the joint.    Consulted and Agree with Plan of Care Patient           Patient will benefit from skilled therapeutic intervention in order to improve the following deficits and impairments:   Body Structure / Function / Physical Skills: ADL, ROM, Obesity, IADL, Edema, Balance, Decreased knowledge of precautions, Gait, Pain, Skin integrity, Mobility, Decreased knowledge of use of DME       Visit Diagnosis: Lymphedema, not elsewhere classified    Problem List Patient Active Problem List   Diagnosis Date Noted  . Vitamin D deficiency 02/13/2019  . Hyperlipidemia 01/09/2014  . Routine general medical examination at a health care facility 10/07/2013  . Morbid obesity ([place]) 10/07/2013    Andrey Spearman, MS, OTR/L, [facility] 11/22/19 10:12 AM  [place] [facility] SERVICES [address] [place], [place], [address] Phone: [phone]   Fax:  [phone]  Name: HIND CHESLER MRN: [id number] Date of Birth: [birth year]

## 2020-01-27 DIAGNOSIS — M25561 Pain in right knee: Secondary | ICD-10-CM

## 2020-01-27 HISTORY — DX: Pain in right knee: M25.561

## 2020-01-30 ENCOUNTER — Telehealth: Payer: Self-pay | Admitting: General Practice

## 2020-01-30 ENCOUNTER — Encounter: Payer: Self-pay | Admitting: Family Medicine

## 2020-01-30 NOTE — Telephone Encounter (Signed)
Received a fax from [employer] in regards to patients Flexitouch Plus. They state that they need your letter written on our office letter head and refaxed to [phone]. I do not see where a letter was written from our office. Paperwork placed in green folder for review.

## 2020-01-30 NOTE — Telephone Encounter (Signed)
Ok to transcribe letter written onto our letterhead and I will sign

## 2020-01-30 NOTE — Telephone Encounter (Signed)
Letter typed, printed and given to PCP for signature and placed in bin for front office staff.

## 2020-01-31 NOTE — Telephone Encounter (Signed)
Faxed to [phone]

## 2020-03-09 ENCOUNTER — Other Ambulatory Visit: Payer: Self-pay | Admitting: Family Medicine

## 2020-03-09 DIAGNOSIS — M1711 Unilateral primary osteoarthritis, right knee: Secondary | ICD-10-CM | POA: Insufficient documentation

## 2020-03-09 HISTORY — DX: Unilateral primary osteoarthritis, right knee: M17.11

## 2020-03-10 ENCOUNTER — Encounter: Payer: BC Managed Care – PPO | Admitting: Family Medicine

## 2020-03-19 DIAGNOSIS — G589 Mononeuropathy, unspecified: Secondary | ICD-10-CM | POA: Insufficient documentation

## 2020-03-19 HISTORY — DX: Mononeuropathy, unspecified: G58.9

## 2020-03-24 ENCOUNTER — Ambulatory Visit: Payer: BC Managed Care – PPO | Admitting: Podiatry

## 2020-08-24 ENCOUNTER — Encounter: Payer: Self-pay | Admitting: Family Medicine

## 2020-08-24 ENCOUNTER — Other Ambulatory Visit: Payer: Self-pay

## 2020-09-30 ENCOUNTER — Encounter: Payer: Self-pay | Admitting: *Deleted

## 2020-10-19 ENCOUNTER — Telehealth: Payer: Self-pay

## 2020-10-19 NOTE — Telephone Encounter (Signed)
Pt has not been seen in over a year so she would need an OV and not just a lab visit

## 2020-10-19 NOTE — Telephone Encounter (Signed)
Pt wants to schedule appt for labs to have her Cholesterol checked. I have scheduled appt on 07/26 but blood work needs to be ordered?  Pt call back [phone]

## 2020-10-19 NOTE — Telephone Encounter (Signed)
Patient scheduled.

## 2020-10-21 ENCOUNTER — Ambulatory Visit: Payer: BC Managed Care – PPO | Admitting: Registered Nurse

## 2020-10-21 ENCOUNTER — Encounter: Payer: Self-pay | Admitting: Registered Nurse

## 2020-10-21 ENCOUNTER — Other Ambulatory Visit: Payer: Self-pay

## 2020-10-21 VITALS — BP 122/77 | HR 68 | Temp 98.2°F | Resp 18 | Ht 63.0 in | Wt 251.2 lb

## 2020-10-21 DIAGNOSIS — Z130 Encounter for screening for diseases of the blood and blood-forming organs and certain disorders involving the immune mechanism: Secondary | ICD-10-CM | POA: Diagnosis not present

## 2020-10-21 DIAGNOSIS — E785 Hyperlipidemia, unspecified: Secondary | ICD-10-CM | POA: Diagnosis not present

## 2020-10-21 DIAGNOSIS — R7303 Prediabetes: Secondary | ICD-10-CM

## 2020-10-21 DIAGNOSIS — Z13228 Encounter for screening for other metabolic disorders: Secondary | ICD-10-CM

## 2020-10-21 DIAGNOSIS — Z1329 Encounter for screening for other suspected endocrine disorder: Secondary | ICD-10-CM | POA: Diagnosis not present

## 2020-10-21 HISTORY — DX: Prediabetes: R73.03

## 2020-10-21 LAB — LIPID PANEL
Cholesterol: 179 mg/dL (ref 0–200)
HDL: 77.2 mg/dL (ref >39.00)
LDL Cholesterol: 87 mg/dL (ref 0–99)
NonHDL: 102.06
Total CHOL/HDL Ratio: 2
Triglycerides: 74 mg/dL (ref 0.0–149.0)
VLDL: 14.8 mg/dL (ref 0.0–40.0)

## 2020-10-21 LAB — COMPREHENSIVE METABOLIC PANEL
ALT: 23 U/L (ref 0–35)
AST: 18 U/L (ref 0–37)
Albumin: 4.2 g/dL (ref 3.5–5.2)
Alkaline Phosphatase: 54 U/L (ref 39–117)
BUN: 18 mg/dL (ref 6–23)
CO2: 28 mEq/L (ref 19–32)
Calcium: 9.3 mg/dL (ref 8.4–10.5)
Chloride: 104 mEq/L (ref 96–112)
Creatinine, Ser: 0.46 mg/dL (ref 0.40–1.20)
GFR: 102.04 mL/min (ref >60.00)
Glucose, Bld: 92 mg/dL (ref 70–99)
Potassium: 4.2 mEq/L (ref 3.5–5.1)
Sodium: 141 mEq/L (ref 135–145)
Total Bilirubin: 0.7 mg/dL (ref 0.2–1.2)
Total Protein: 6.4 g/dL (ref 6.0–8.3)

## 2020-10-21 LAB — CBC WITH DIFFERENTIAL/PLATELET
Basophils Absolute: 0 10*3/uL (ref 0.0–0.1)
Basophils Relative: 0.6 % (ref 0.0–3.0)
Eosinophils Absolute: 0.1 10*3/uL (ref 0.0–0.7)
Eosinophils Relative: 1.2 % (ref 0.0–5.0)
HCT: 44.4 % (ref 36.0–46.0)
Hemoglobin: 14.9 g/dL (ref 12.0–15.0)
Lymphocytes Relative: 20.3 % (ref 12.0–46.0)
Lymphs Abs: 1.1 10*3/uL (ref 0.7–4.0)
MCHC: 33.6 g/dL (ref 30.0–36.0)
MCV: 90 fl (ref 78.0–100.0)
Monocytes Absolute: 0.3 10*3/uL (ref 0.1–1.0)
Monocytes Relative: 6 % (ref 3.0–12.0)
Neutro Abs: 3.8 10*3/uL (ref 1.4–7.7)
Neutrophils Relative %: 71.9 % (ref 43.0–77.0)
Platelets: 155 10*3/uL (ref 150.0–400.0)
RBC: 4.93 Mil/uL (ref 3.87–5.11)
RDW: 13.7 % (ref 11.5–15.5)
WBC: 5.3 10*3/uL (ref 4.0–10.5)

## 2020-10-21 LAB — TSH: TSH: 1.76 u[IU]/mL (ref 0.35–5.50)

## 2020-10-21 LAB — HEMOGLOBIN A1C: Hgb A1c MFr Bld: 5.7 % (ref 4.6–6.5)

## 2020-10-21 MED ORDER — ROSUVASTATIN CALCIUM 10 MG PO TABS: ORAL_TABLET | ORAL | 3 refills | Status: DC

## 2020-10-21 NOTE — Progress Notes (Signed)
Established Patient Office Visit  Subjective:  Patient ID: Mary Montgomery, female    DOB: June 13, 1955  Age: 65 y.o. MRN: [id number]  CC:  Chief Complaint  Patient presents with   Hyperlipidemia    Patient states she is here to discuss getting her cholesterol checked. Patient states that she was put on rosuvastatin not that long ago and would like to check her levels.     HPI [name] presents for hyperlipidemia  On rosuvastatin 10mg  PO qd. Has taken for a number of months. No AE. Has stayed conscious of diet. Continues to work on weight loss. Seeing dietician.  Otherwise no concerns.   Past Medical History:  Diagnosis Date   Arthritis    right knee   Bulging disc    Lumber 3/4   Degenerated intervertebral disc    neck   Hay fever    Hyperlipemia    Hypertension    Plantar fasciitis    bilaterl feet    Past Surgical History:  Procedure Laterality Date   CYSTECTOMY  1988   polynidal cyst   WISDOM TOOTH EXTRACTION  1977    Family History  Problem Relation Age of Onset   Diabetes Mother    Hypertension Mother    Hyperlipidemia Mother    Dementia Mother    Parkinsonism Father    Arthritis Father        and father side of family   Coarctation of the aorta Brother     Social History   Socioeconomic History   Marital status: Married    Spouse name: Not on file   Number of children: Not on file   Years of education: Not on file   Highest education level: Not on file  Occupational History   Not on file  Tobacco Use   Smoking status: Never   Smokeless tobacco: Never  Substance and Sexual Activity   Alcohol use: No   Drug use: No   Sexual activity: Not on file  Other Topics Concern   Not on file  Social History Narrative   Not on file   Social Determinants of Health   Financial Resource Strain: Not on file  Food Insecurity: Not on file  Transportation Needs: Not on file  Physical Activity: Not on file  Stress: Not on file  Social  Connections: Not on file  Intimate Partner Violence: Not on file    Outpatient Medications Prior to Visit  Medication Sig Dispense Refill   Multiple Vitamin (MULTIVITAMIN) tablet Take 1 tablet by mouth daily.     OVER THE COUNTER MEDICATION [place]     OVER THE COUNTER MEDICATION Endocrine complete     rosuvastatin (CRESTOR) 10 MG tablet TAKE 1 TABLET(10 MG) BY MOUTH DAILY 30 tablet 6   cholecalciferol (VITAMIN D) 1000 UNITS tablet Take 1,000 Units by mouth daily. (Patient not taking: Reported on 10/21/2020)     cycloSPORINE (RESTASIS) 0.05 % ophthalmic emulsion 1 drop 2 (two) times daily. (Patient not taking: Reported on 10/21/2020)     No facility-administered medications prior to visit.    No Known Allergies  ROS Review of Systems  Constitutional: Negative.   HENT: Negative.    Eyes: Negative.   Respiratory: Negative.    Cardiovascular: Negative.   Gastrointestinal: Negative.   Genitourinary: Negative.   Musculoskeletal: Negative.   Skin: Negative.   Neurological: Negative.   Psychiatric/Behavioral: Negative.    All other systems reviewed and are negative.    Objective:  Physical Exam Vitals and nursing note reviewed.  Constitutional:      General: She is not in acute distress.    Appearance: Normal appearance. She is normal weight. She is not ill-appearing, toxic-appearing or diaphoretic.  Cardiovascular:     Rate and Rhythm: Normal rate and regular rhythm.     Heart sounds: Normal heart sounds. No murmur heard.   No friction rub. No gallop.  Pulmonary:     Effort: Pulmonary effort is normal. No respiratory distress.     Breath sounds: Normal breath sounds. No stridor. No wheezing, rhonchi or rales.  Chest:     Chest wall: No tenderness.  Skin:    General: Skin is warm and dry.  Neurological:     General: No focal deficit present.     Mental Status: She is alert and oriented to person, place, and time. Mental status is at baseline.  Psychiatric:        Mood  and Affect: Mood normal.        Behavior: Behavior normal.        Thought Content: Thought content normal.        Judgment: Judgment normal.    BP 122/77   Pulse 68   Temp 98.2 F (36.8 C) (Temporal)   Resp 18   Ht 5\' 3"  (1.6 m)   Wt 251 lb 3.2 oz (113.9 kg)   LMP 10/01/2013   SpO2 99%   BMI 44.50 kg/m  Wt Readings from Last 3 Encounters:  10/21/20 251 lb 3.2 oz (113.9 kg)  09/11/19 251 lb 4 oz (114 kg)  02/13/19 258 lb 8 oz (117.3 kg)     There are no preventive care reminders to display for this patient.  There are no preventive care reminders to display for this patient.  Lab Results  Component Value Date   TSH 1.76 10/21/2020   Lab Results  Component Value Date   WBC 5.3 10/21/2020   HGB 14.9 10/21/2020   HCT 44.4 10/21/2020   MCV 90.0 10/21/2020   PLT 155.0 10/21/2020   Lab Results  Component Value Date   NA 141 10/21/2020   K 4.2 10/21/2020   CO2 28 10/21/2020   GLUCOSE 92 10/21/2020   BUN 18 10/21/2020   CREATININE 0.46 10/21/2020   BILITOT 0.7 10/21/2020   ALKPHOS 54 10/21/2020   AST 18 10/21/2020   ALT 23 10/21/2020   PROT 6.4 10/21/2020   ALBUMIN 4.2 10/21/2020   CALCIUM 9.3 10/21/2020   ANIONGAP 8 01/21/2018   GFR 102.04 10/21/2020   Lab Results  Component Value Date   CHOL 179 10/21/2020   Lab Results  Component Value Date   HDL 77.20 10/21/2020   Lab Results  Component Value Date   LDLCALC 87 10/21/2020   Lab Results  Component Value Date   TRIG 74.0 10/21/2020   Lab Results  Component Value Date   CHOLHDL 2 10/21/2020   Lab Results  Component Value Date   HGBA1C 5.7 10/21/2020      Assessment & Plan:   Problem List Items Addressed This Visit       Other   Hyperlipidemia - Primary   Relevant Medications   rosuvastatin (CRESTOR) 10 MG tablet   Other Relevant Orders   Lipid panel (Completed)   Prediabetes   Relevant Orders   Hemoglobin A1c (Completed)   Other Visit Diagnoses     Screening for endocrine,  metabolic and immunity disorder       Relevant  Orders   CBC with Differential/Platelet (Completed)   Comprehensive metabolic panel (Completed)   TSH (Completed)       Meds ordered this encounter  Medications   rosuvastatin (CRESTOR) 10 MG tablet    Sig: TAKE 1 TABLET(10 MG) BY MOUTH DAILY    Dispense:  90 tablet    Refill:  3    Order Specific Question:   Supervising Provider    Answer:   Neva Seat, JEFFREY R [[id number]]    Follow-up: Return in about 1 year (around 10/21/2021) for CPE and labs.   PLAN Labs collected. Will follow up with the patient as warranted. If improving, will maintain 10mg  dosing. If stable or worsening will increase to 20mg  PO qd.  Recheck in dec as scheduled with PCP - coming back for CPE Patient encouraged to call clinic with any questions, comments, or concerns.  , NP

## 2020-10-21 NOTE — Patient Instructions (Addendum)
Ms. Mary Montgomery to meet you  Labs will be back today or tomorrow. I'll call with any concerns.  Let's plan on a 1 year follow up unless labs indicate otherwise  Can consider Wegovy or Wellbutin if you need help with weight loss  Thank you  Rich     If you have lab work done today you will be contacted with your lab results within the next 2 weeks.  If you have not heard from [place] then please contact us. The fastest way to get your results is to register for My Chart.   IF you received an x-ray today, you will receive an invoice from [facility]. Please contact [facility] at [phone] with questions or concerns regarding your invoice.   IF you received labwork today, you will receive an invoice from [facility]. Please contact [facility] at [phone] with questions or concerns regarding your invoice.   Our billing staff will not be able to assist you with questions regarding bills from these companies.  You will be contacted with the lab results as soon as they are available. The fastest way to get your results is to activate your My Chart account. Instructions are located on the last page of this paperwork. If you have not heard from [place] regarding the results in 2 weeks, please contact this office.

## 2020-10-27 ENCOUNTER — Other Ambulatory Visit: Payer: BC Managed Care – PPO

## 2020-12-03 ENCOUNTER — Other Ambulatory Visit: Payer: Self-pay | Admitting: Family Medicine

## 2020-12-03 DIAGNOSIS — E785 Hyperlipidemia, unspecified: Secondary | ICD-10-CM

## 2021-02-15 ENCOUNTER — Ambulatory Visit: Payer: BC Managed Care – PPO | Admitting: Podiatry

## 2021-02-24 ENCOUNTER — Ambulatory Visit: Payer: BC Managed Care – PPO | Admitting: Podiatry

## 2021-02-24 ENCOUNTER — Other Ambulatory Visit: Payer: Self-pay

## 2021-02-24 ENCOUNTER — Encounter: Payer: Self-pay | Admitting: Podiatry

## 2021-02-24 VITALS — BP 136/74 | HR 69 | Temp 97.9°F

## 2021-02-24 DIAGNOSIS — Q828 Other specified congenital malformations of skin: Secondary | ICD-10-CM | POA: Diagnosis not present

## 2021-02-24 DIAGNOSIS — M21622 Bunionette of left foot: Secondary | ICD-10-CM | POA: Diagnosis not present

## 2021-02-24 NOTE — Progress Notes (Signed)
  Subjective:  Patient ID: Mary Montgomery, female    DOB: Jul 06, 1955,   MRN: [id number]  Chief Complaint  Patient presents with   Callouses    Left foot lateral aspect callus onset about 10 years ago that comes and goes. More painful now because she is putting more pressure on the area.     65 y.o. female presents for concern of callus on the outside of her left foot that has been there for about 10 years. She has tried corn pads, various inserts, lotions with urea and salycylic acid with mild relief. Patient hoping to have it trimmed today and discuss ways to keep it away . Denies any other pedal complaints. Denies n/v/f/c.   Past Medical History:  Diagnosis Date   Arthritis    right knee   Bulging disc    Lumber 3/4   Degenerated intervertebral disc    neck   Hay fever    Hyperlipemia    Hypertension    Plantar fasciitis    bilaterl feet    Objective:  Physical Exam: Vascular: DP/PT pulses 2/4 bilateral. CFT <3 seconds. Normal hair growth on digits. No edema.  Skin. No lacerations or abrasions bilateral feet. Hyperkeratotic cored lesion noted to left fifth metatarsal head.  Musculoskeletal: MMT 5/5 bilateral lower extremities in DF, PF, Inversion and Eversion. Deceased ROM in DF of ankle joint. Tailors bunions noted with some mild tenderness to palpation. No pain with ROM of the fifth digit.  Neurological: Sensation intact to light touch.   Assessment:   1. Porokeratosis   2. Tailor's bunion of left foot      Plan:  Patient was evaluated and treated and all questions answered. -Discussed corns and calluses with patient and treatment options.  -Hyperkeratotic tissue was debrided with chisel without incident.  -Applied salycylic acid treatment to area with dressing. Advised to remove bandaging tomorrow.  -Encouraged daily moisturizing -Discussed use of pumice stone -Advised good supportive shoes and inserts -Discussed surgical options if patient continues to have  pain.  -Patient to return to office as needed or sooner if condition worsens.   Louann Sjogren, DPM

## 2021-03-15 ENCOUNTER — Encounter: Payer: Self-pay | Admitting: Family Medicine

## 2021-06-09 ENCOUNTER — Other Ambulatory Visit (HOSPITAL_BASED_OUTPATIENT_CLINIC_OR_DEPARTMENT_OTHER): Payer: Self-pay | Admitting: Family Medicine

## 2021-06-09 DIAGNOSIS — Z1231 Encounter for screening mammogram for malignant neoplasm of breast: Secondary | ICD-10-CM

## 2021-06-29 ENCOUNTER — Ambulatory Visit (HOSPITAL_BASED_OUTPATIENT_CLINIC_OR_DEPARTMENT_OTHER): Payer: BC Managed Care – PPO

## 2021-07-04 ENCOUNTER — Encounter: Payer: Self-pay | Admitting: Family Medicine

## 2021-08-16 ENCOUNTER — Encounter: Payer: Self-pay | Admitting: Family Medicine

## 2021-08-18 ENCOUNTER — Ambulatory Visit (HOSPITAL_BASED_OUTPATIENT_CLINIC_OR_DEPARTMENT_OTHER)
Admission: RE | Admit: 2021-08-18 | Discharge: 2021-08-18 | Disposition: A | Discharge status: Home / Self Care | Payer: BC Managed Care – PPO | Source: Ambulatory Visit | Attending: Family Medicine | Admitting: Family Medicine

## 2021-08-18 ENCOUNTER — Encounter (HOSPITAL_BASED_OUTPATIENT_CLINIC_OR_DEPARTMENT_OTHER): Payer: Self-pay

## 2021-08-18 DIAGNOSIS — Z1231 Encounter for screening mammogram for malignant neoplasm of breast: Secondary | ICD-10-CM | POA: Diagnosis present

## 2021-08-20 ENCOUNTER — Ambulatory Visit: Payer: BC Managed Care – PPO | Admitting: Family Medicine

## 2021-08-20 ENCOUNTER — Encounter: Payer: Self-pay | Admitting: Family Medicine

## 2021-08-20 VITALS — BP 136/82 | HR 60 | Temp 98.2°F | Resp 16 | Ht 63.0 in | Wt 266.6 lb

## 2021-08-20 DIAGNOSIS — H6122 Impacted cerumen, left ear: Secondary | ICD-10-CM | POA: Diagnosis not present

## 2021-08-20 NOTE — Progress Notes (Signed)
   Subjective:    Patient ID: Mary Montgomery, female    DOB: 08/18/1955, 66 y.o.   MRN: [id number]  HPI Decreased hearing- L ear, sxs started ~1 month ago.  Pt used Debrox at onset which improved sxs but she still has 'a wax seal'.  She is unable to lie on L side w/o it 'closing up'  Has a lot of 'white noise' in L ear.  Denies pain.  No drainage.   Review of Systems For ROS see HPI     Objective:   Physical Exam Vitals reviewed.  Constitutional:      General: She is not in acute distress.    Appearance: Normal appearance. She is not ill-appearing.  HENT:     Head: Normocephalic and atraumatic.     Right Ear: Tympanic membrane and ear canal normal. There is no impacted cerumen.     Left Ear: There is impacted cerumen.     Ears:     Comments: L EAC w/ soft glob of wax at opening.  Pt consented to wax removal.  Large amount of wax removed w/ curette.  Pt reports immediate relief.  TM WNL Neurological:     Mental Status: She is alert.          Assessment & Plan:  Cerumen impaction on L w/ hearing loss- new.  L EAC w/ large amount of soft wax at opening of canal.  Pt consented to removal w/ curette.  Wax successfully removed and pt experienced immediate relief.  Tolerated procedure.

## 2021-08-20 NOTE — Patient Instructions (Signed)
Follow up as needed or as scheduled Ears are now clear!  Hopefully this will feel MUCH better!!! Call with any questions or concerns Stay Safe!  Stay Healthy! Happy Early [place] Ouch!!!

## 2021-08-26 ENCOUNTER — Ambulatory Visit: Payer: BC Managed Care – PPO | Admitting: Family Medicine

## 2021-09-16 ENCOUNTER — Ambulatory Visit (INDEPENDENT_AMBULATORY_CARE_PROVIDER_SITE_OTHER): Payer: BC Managed Care – PPO | Admitting: Family Medicine

## 2021-09-16 ENCOUNTER — Encounter: Payer: Self-pay | Admitting: Family Medicine

## 2021-09-16 VITALS — BP 130/82 | HR 65 | Temp 97.8°F | Resp 16 | Ht 63.0 in | Wt 261.2 lb

## 2021-09-16 DIAGNOSIS — E559 Vitamin D deficiency, unspecified: Secondary | ICD-10-CM

## 2021-09-16 DIAGNOSIS — Z23 Encounter for immunization: Secondary | ICD-10-CM

## 2021-09-16 DIAGNOSIS — Z1211 Encounter for screening for malignant neoplasm of colon: Secondary | ICD-10-CM

## 2021-09-16 DIAGNOSIS — H9319 Tinnitus, unspecified ear: Secondary | ICD-10-CM

## 2021-09-16 DIAGNOSIS — Z0000 Encounter for general adult medical examination without abnormal findings: Secondary | ICD-10-CM | POA: Diagnosis not present

## 2021-09-16 LAB — CBC WITH DIFFERENTIAL/PLATELET
Basophils Absolute: 0 10*3/uL (ref 0.0–0.1)
Basophils Relative: 0.8 % (ref 0.0–3.0)
Eosinophils Absolute: 0.1 10*3/uL (ref 0.0–0.7)
Eosinophils Relative: 1.8 % (ref 0.0–5.0)
HCT: 45 % (ref 36.0–46.0)
Hemoglobin: 15 g/dL (ref 12.0–15.0)
Lymphocytes Relative: 24.8 % (ref 12.0–46.0)
Lymphs Abs: 1.1 10*3/uL (ref 0.7–4.0)
MCHC: 33.4 g/dL (ref 30.0–36.0)
MCV: 91.1 fl (ref 78.0–100.0)
Monocytes Absolute: 0.3 10*3/uL (ref 0.1–1.0)
Monocytes Relative: 6.6 % (ref 3.0–12.0)
Neutro Abs: 2.9 10*3/uL (ref 1.4–7.7)
Neutrophils Relative %: 66 % (ref 43.0–77.0)
Platelets: 160 10*3/uL (ref 150.0–400.0)
RBC: 4.94 Mil/uL (ref 3.87–5.11)
RDW: 14.2 % (ref 11.5–15.5)
WBC: 4.5 10*3/uL (ref 4.0–10.5)

## 2021-09-16 LAB — LIPID PANEL
Cholesterol: 162 mg/dL (ref 0–200)
HDL: 76.9 mg/dL (ref >39.00)
LDL Cholesterol: 71 mg/dL (ref 0–99)
NonHDL: 85.48
Total CHOL/HDL Ratio: 2
Triglycerides: 71 mg/dL (ref 0.0–149.0)
VLDL: 14.2 mg/dL (ref 0.0–40.0)

## 2021-09-16 LAB — HEPATIC FUNCTION PANEL
ALT: 24 U/L (ref 0–35)
AST: 20 U/L (ref 0–37)
Albumin: 4.2 g/dL (ref 3.5–5.2)
Alkaline Phosphatase: 51 U/L (ref 39–117)
Bilirubin, Direct: 0.2 mg/dL (ref 0.0–0.3)
Total Bilirubin: 0.7 mg/dL (ref 0.2–1.2)
Total Protein: 7 g/dL (ref 6.0–8.3)

## 2021-09-16 LAB — BASIC METABOLIC PANEL
BUN: 17 mg/dL (ref 6–23)
CO2: 31 mEq/L (ref 19–32)
Calcium: 9.2 mg/dL (ref 8.4–10.5)
Chloride: 103 mEq/L (ref 96–112)
Creatinine, Ser: 0.6 mg/dL (ref 0.40–1.20)
GFR: 95.1 mL/min (ref >60.00)
Glucose, Bld: 95 mg/dL (ref 70–99)
Potassium: 4.2 mEq/L (ref 3.5–5.1)
Sodium: 141 mEq/L (ref 135–145)

## 2021-09-16 LAB — TSH: TSH: 1.41 u[IU]/mL (ref 0.35–5.50)

## 2021-09-16 LAB — VITAMIN D 25 HYDROXY (VIT D DEFICIENCY, FRACTURES): VITD: 58.98 ng/mL (ref 30.00–100.00)

## 2021-09-16 NOTE — Assessment & Plan Note (Signed)
Ongoing issue for pt.  BMI 46.28.  She is down 6 lbs and starts seeing [facility] next week.  Encouraged low carb diet and regular exercise.  Check labs to risk stratify.  Will follow.

## 2021-09-16 NOTE — Patient Instructions (Addendum)
Follow up in 1 year or as needed We'll notify you of your lab results and make any changes if needed Continue to work on healthy diet and regular exercise- you can do it!! Complete the cologuard and return as directed We'll call you to schedule your ENT appt Call with any questions or concerns Stay Safe!  Stay Healthy! Have a great summer!!!

## 2021-09-16 NOTE — Progress Notes (Signed)
   Subjective:    Patient ID: Mary Montgomery, female    DOB: 1956-02-11, 66 y.o.   MRN: [id number]  HPI CPE-UTD on pap, mammo.  Tdap coming due.  Due for colon cancer screen  Health Maintenance  Topic Date Due   COVID-19 Vaccine (6 - Pfizer series) 10/02/2021 (Originally 02/11/2021)   COLONOSCOPY (Pts 45-72yrs Insurance coverage will need to be confirmed)  10/21/2021 (Originally 10/03/2020)   HIV Screening  10/21/2021 (Originally 08/27/1972)   TETANUS/TDAP  10/23/2021   INFLUENZA VACCINE  11/02/2021   PAP SMEAR-Modifier  02/12/2022   MAMMOGRAM  08/19/2023   Hepatitis C Screening  Completed   Zoster Vaccines- Shingrix  Completed   HPV VACCINES  Aged Out      Review of Systems Patient reports no vision/ hearing changes, adenopathy,fever, persistant/recurrent hoarseness , swallowing issues, chest pain, palpitations, edema, persistant/recurrent cough, hemoptysis, dyspnea (rest/exertional/paroxysmal nocturnal), gastrointestinal bleeding (melena, rectal bleeding), abdominal pain, significant heartburn, bowel changes, GU symptoms (dysuria, hematuria, incontinence), Gyn symptoms (abnormal  bleeding, pain),  syncope, focal weakness, memory loss, numbness & tingling, skin/hair/nail changes, abnormal bruising or bleeding, anxiety, or depression.   + 6 lb weight loss- pt starts seeing [facility] next week + ringing in ears    Objective:   Physical Exam General Appearance:    Alert, cooperative, no distress, appears stated age  Head:    Normocephalic, without obvious abnormality, atraumatic  Eyes:    PERRL, conjunctiva/corneas clear, EOM's intact both eyes  Ears:    Normal TM's and external ear canals, both ears  Nose:   Nares normal, septum midline, mucosa normal, no drainage    or sinus tenderness  Throat:   Lips, mucosa, and tongue normal; teeth and gums normal  Neck:   Supple, symmetrical, trachea midline, no adenopathy;    Thyroid: no enlargement/tenderness/nodules  Back:      Symmetric, no curvature, ROM normal, no CVA tenderness  Lungs:     Clear to auscultation bilaterally, respirations unlabored  Chest Wall:    No tenderness or deformity   Heart:    Regular rate and rhythm, S1 and S2 normal, no murmur, rub   or gallop  Breast Exam:    Deferred to mammo  Abdomen:     Soft, non-tender, bowel sounds active all four quadrants,    no masses, no organomegaly  Genitalia:    Deferred to GYN  Rectal:    Extremities:   Extremities normal, atraumatic, no cyanosis or edema  Pulses:   2+ and symmetric all extremities  Skin:   Skin color, texture, turgor normal, no rashes or lesions  Lymph nodes:   Cervical, supraclavicular, and axillary nodes normal  Neurologic:   CNII-XII intact, normal strength, sensation and reflexes    throughout          Assessment & Plan:

## 2021-09-16 NOTE — Assessment & Plan Note (Signed)
Check labs and replete prn. 

## 2021-09-16 NOTE — Assessment & Plan Note (Signed)
Pt's PE WNL w/ exception of obesity.  UTD on pap, mammo.  Tdap updated.  Due for colon cancer screen- not willing to repeat colonoscopy.  Will do cologuard.  Check labs.  Anticipatory guidance provided.

## 2021-09-17 NOTE — Progress Notes (Signed)
Pt seen results Via my chart  

## 2021-10-08 LAB — COLOGUARD: COLOGUARD: NEGATIVE

## 2021-10-13 ENCOUNTER — Encounter: Payer: Self-pay | Admitting: Family Medicine

## 2021-10-21 DIAGNOSIS — H9312 Tinnitus, left ear: Secondary | ICD-10-CM | POA: Insufficient documentation

## 2021-10-21 HISTORY — DX: Tinnitus, left ear: H93.12

## 2021-11-09 ENCOUNTER — Telehealth: Payer: Self-pay

## 2021-11-09 NOTE — Telephone Encounter (Signed)
I received a call from [place] at [facility] about a order for the patient some compression socks. She states that the order was sent on 11/05/2021 around 2:00pm. The diagnosis code was for swelling and I do not see any office notes about swelling. They need notes for insurance to approve the order. Fax# [phone].Please Advise.

## 2021-11-10 NOTE — Telephone Encounter (Signed)
Patient is scheduled for an appointment for tomorrow on mychart at 1:00pm

## 2021-11-10 NOTE — Telephone Encounter (Signed)
We have not discussed her swelling specifically at a visit.  If they need documentation for insurance purposes, she needs to schedule a visit

## 2021-11-11 ENCOUNTER — Encounter: Payer: Self-pay | Admitting: Family Medicine

## 2021-11-11 ENCOUNTER — Telehealth (INDEPENDENT_AMBULATORY_CARE_PROVIDER_SITE_OTHER): Payer: BC Managed Care – PPO | Admitting: Family Medicine

## 2021-11-11 VITALS — Wt 260.0 lb

## 2021-11-11 DIAGNOSIS — I890 Lymphedema, not elsewhere classified: Secondary | ICD-10-CM | POA: Diagnosis not present

## 2021-11-11 NOTE — Progress Notes (Signed)
   Virtual Visit via Video   I connected with patient on 11/11/21 at  1:00 PM EDT by a video enabled telemedicine application and verified that I am speaking with the correct person using two identifiers.  Location patient: Home Location provider: [place] [facility], Office Persons participating in the virtual visit: Patient, Provider, CMA Sheryle Hail C)  I discussed the limitations of evaluation and management by telemedicine and the availability of in person appointments. The patient expressed understanding and agreed to proceed.  Subjective:   HPI:   Leg swelling- pt reports she is supposed to replace her compression garments every 4 months.  She has hx of lymphedema.  If she doesn't wear her compression garments she notes that legs are much more fatigued.  Will swell when not wearing them.  Currently wearing open toe knee high compression socks.  Pt has pneumatic device to use at home to gently pump fluid out of legs when she is in the house.  Wears compression garments when at work or out and about.    ROS:   See pertinent positives and negatives per HPI.  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   Prediabetes 10/21/2020   Pinched nerve 03/19/2020   Vitamin D deficiency 02/13/2019   Hyperlipidemia 01/09/2014   Morbid obesity (HCC) 10/07/2013    Social History   Tobacco Use   Smoking status: Never   Smokeless tobacco: Never  Substance Use Topics   Alcohol use: No    Current Outpatient Medications:    cetirizine (ZYRTEC) 10 MG tablet, Take 10 mg by mouth daily., Disp: , Rfl:    cholecalciferol (VITAMIN D) 1000 UNITS tablet, Take 1,000 Units by mouth daily., Disp: , Rfl:    cycloSPORINE (RESTASIS) 0.05 % ophthalmic emulsion, 1 drop 2 (two) times daily., Disp: , Rfl:    meloxicam (MOBIC) 15 MG tablet, Take 15 mg by mouth daily as needed., Disp: , Rfl:    Multiple Vitamin (MULTIVITAMIN) tablet, Take 1 tablet by mouth daily., Disp:  , Rfl:    rosuvastatin (CRESTOR) 10 MG tablet, TAKE 1 TABLET(10 MG) BY MOUTH DAILY, Disp: 90 tablet, Rfl: 3  No Known Allergies  Objective:   Wt 260 lb (117.9 kg)   LMP 10/01/2013   BMI 46.06 kg/m  AAOx3, NAD NCAT, EOMI No obvious CN deficits Coloring WNL Pt is able to speak clearly, coherently without shortness of breath or increased work of breathing.  Thought process is linear.  Mood is appropriate.   Assessment and Plan:   Lymphedema- ongoing issue for pt.  She is in need of new compression garments so that she is able to manage her sxs when she is at work or out of the house.  She is able to use her pneumatic compression device while at home if needed.  The compression garments help to prevent swelling and excessive leg fatigue.  Will fax prescription to [facility].  Pt expressed understanding and is in agreement w/ plan.    Neena Rhymes, MD 11/11/2021

## 2021-12-16 ENCOUNTER — Encounter: Payer: Self-pay | Admitting: Family Medicine

## 2021-12-16 ENCOUNTER — Ambulatory Visit: Payer: BC Managed Care – PPO | Admitting: Family Medicine

## 2021-12-16 VITALS — BP 138/80 | HR 58 | Temp 98.5°F | Resp 16 | Ht 63.0 in | Wt 259.8 lb

## 2021-12-16 DIAGNOSIS — B9689 Other specified bacterial agents as the cause of diseases classified elsewhere: Secondary | ICD-10-CM | POA: Diagnosis not present

## 2021-12-16 DIAGNOSIS — J329 Chronic sinusitis, unspecified: Secondary | ICD-10-CM

## 2021-12-16 MED ORDER — AMOXICILLIN 875 MG PO TABS: 875.0000 mg | ORAL_TABLET | Freq: Two times a day (BID) | ORAL | 0 refills | Status: AC

## 2021-12-16 NOTE — Patient Instructions (Signed)
Follow up as needed or as scheduled START the Amoxicillin twice daily- take w/ food Drink lots of fluids REST! Continue your allergy medications daily Call with any questions or concerns Hang in there!!!

## 2021-12-16 NOTE — Progress Notes (Signed)
   Subjective:    Patient ID: Mary Montgomery, female    DOB: 1956-01-03, 66 y.o.   MRN: [id number]  HPI Sinus pain- sxs started ~3 weeks ago.  Initially thought it was allergies but no improvement w/ Flonase. Having maxillary sinus pain, pressure in ears bilaterally, and sore throat.  No fevers.  No body aches/chills.  On Zyrtec daily.  No known sick contacts, husband has not developed similar sxs.   Review of Systems For ROS see HPI     Objective:   Physical Exam Vitals reviewed.  Constitutional:      General: She is not in acute distress.    Appearance: Normal appearance. She is well-developed. She is not ill-appearing.  HENT:     Head: Normocephalic and atraumatic.     Right Ear: Tympanic membrane normal.     Left Ear: Tympanic membrane normal.     Nose: Mucosal edema and congestion present. No rhinorrhea.     Right Sinus: Maxillary sinus tenderness and frontal sinus tenderness present.     Left Sinus: Maxillary sinus tenderness and frontal sinus tenderness present.     Mouth/Throat:     Pharynx: Uvula midline. Posterior oropharyngeal erythema present. No oropharyngeal exudate.  Eyes:     Conjunctiva/sclera: Conjunctivae normal.     Pupils: Pupils are equal, round, and reactive to light.  Cardiovascular:     Rate and Rhythm: Normal rate and regular rhythm.     Heart sounds: Normal heart sounds.  Pulmonary:     Effort: Pulmonary effort is normal. No respiratory distress.     Breath sounds: Normal breath sounds. No wheezing.  Musculoskeletal:     Cervical back: Normal range of motion and neck supple.  Lymphadenopathy:     Cervical: No cervical adenopathy.  Skin:    General: Skin is warm and dry.  Neurological:     General: No focal deficit present.     Mental Status: She is alert and oriented to person, place, and time.     Cranial Nerves: No cranial nerve deficit.     Motor: No weakness.     Coordination: Coordination normal.  Psychiatric:        Mood and Affect:  Mood normal.        Behavior: Behavior normal.        Thought Content: Thought content normal.           Assessment & Plan:  Bacterial sinusitis- new.  Pt's sxs and PE consistent w/ illness.  Start Amoxicillin 875mg  BID.  Reviewed supportive care and red flags that should prompt return.  Pt expressed understanding and is in agreement w/ plan.

## 2022-01-12 ENCOUNTER — Other Ambulatory Visit: Payer: Self-pay

## 2022-01-12 DIAGNOSIS — E785 Hyperlipidemia, unspecified: Secondary | ICD-10-CM

## 2022-01-12 MED ORDER — ROSUVASTATIN CALCIUM 10 MG PO TABS: ORAL_TABLET | ORAL | 3 refills | Status: DC

## 2022-10-19 IMAGING — MG MM DIGITAL SCREENING BILAT W/ TOMO AND CAD
6 of 10 series · 6 of 30 positions shown · non-contrast
Comparison: Previous exam(s).

ACR Breast Density Category a: The breast tissue is almost entirely
fatty.

CLINICAL DATA: Screening.

EXAM:
DIGITAL SCREENING BILATERAL MAMMOGRAM WITH TOMOSYNTHESIS AND CAD
TECHNIQUE: Bilateral screening digital craniocaudal and mediolateral oblique
mammograms were obtained. Bilateral screening digital breast
tomosynthesis was performed. The images were evaluated with
computer-aided detection.

[L MLO synth-2D]
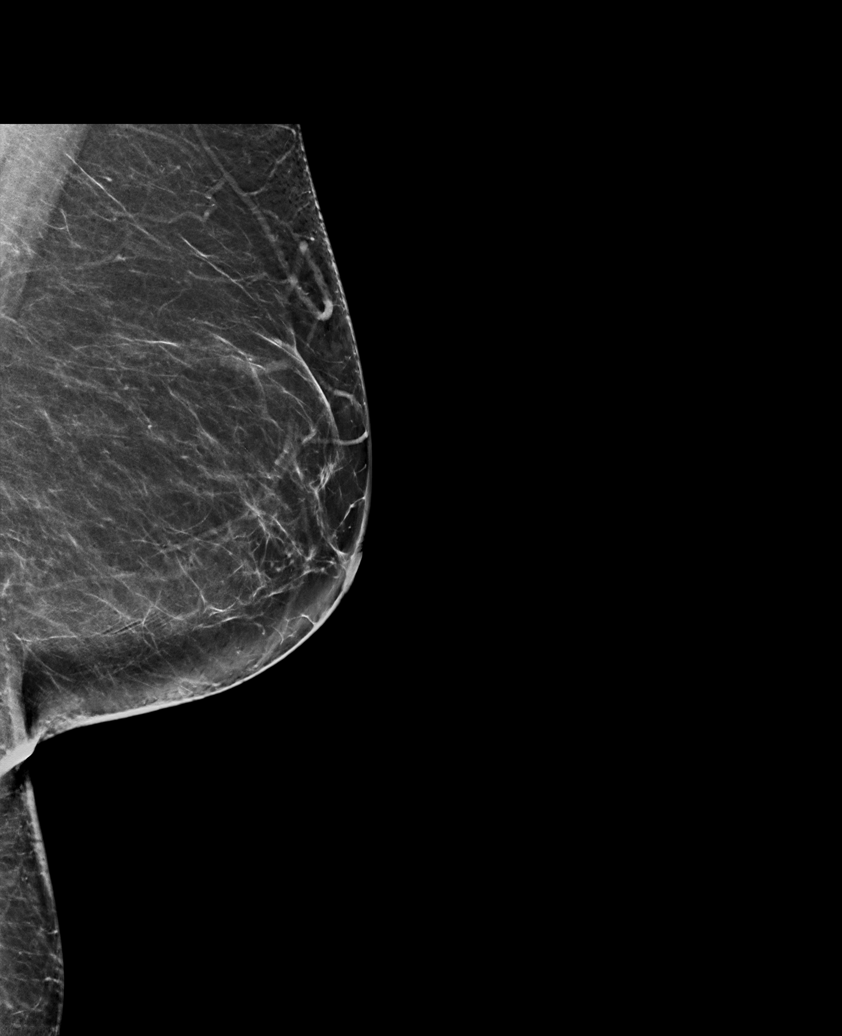

[L CC synth-2D]
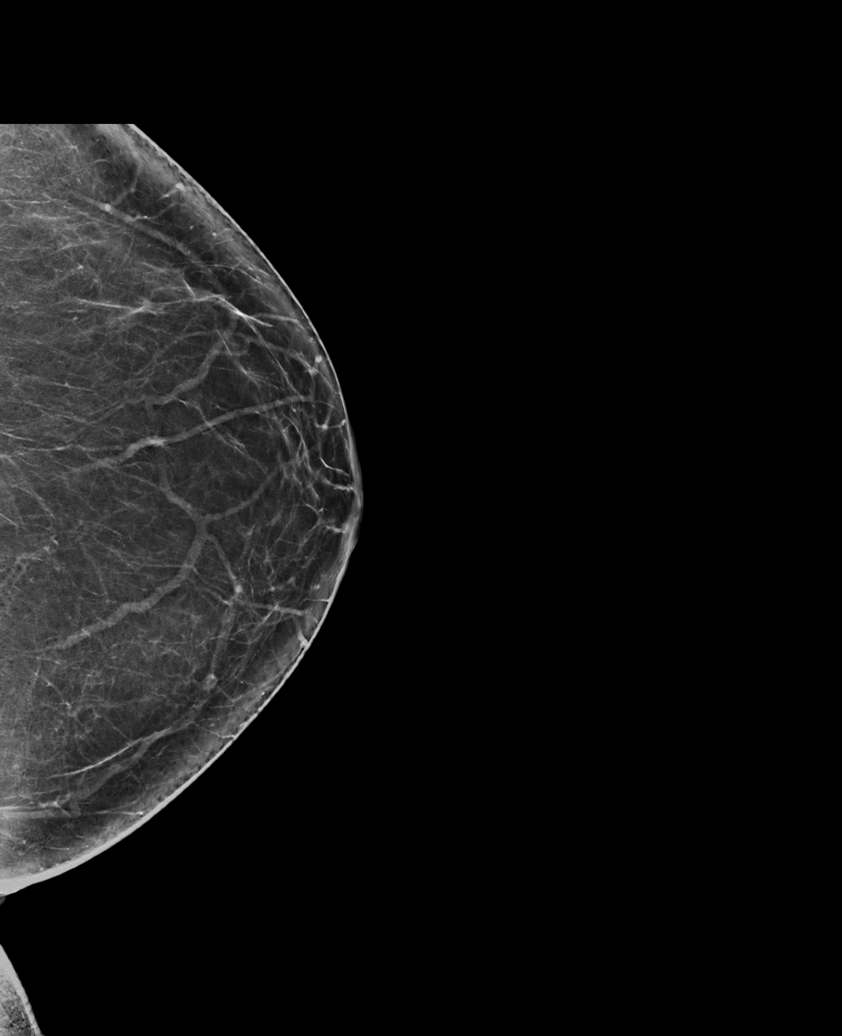

[R MLO synth-2D]
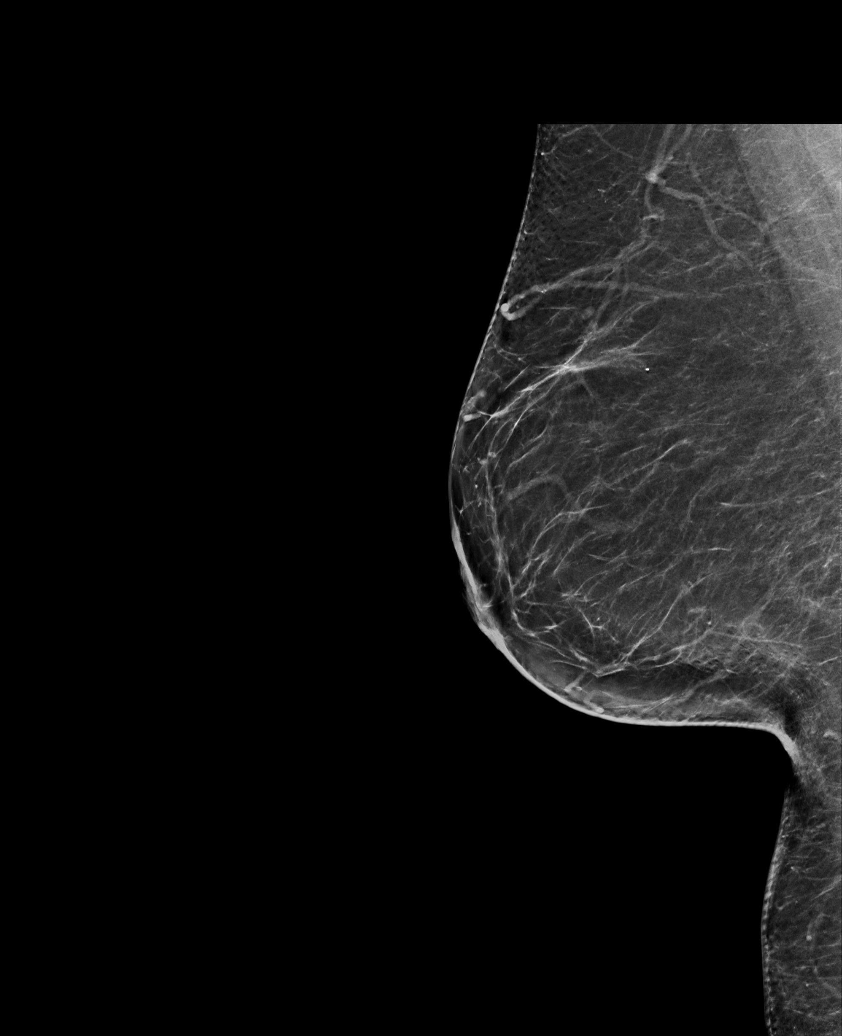

[R XCCL synth-2D]
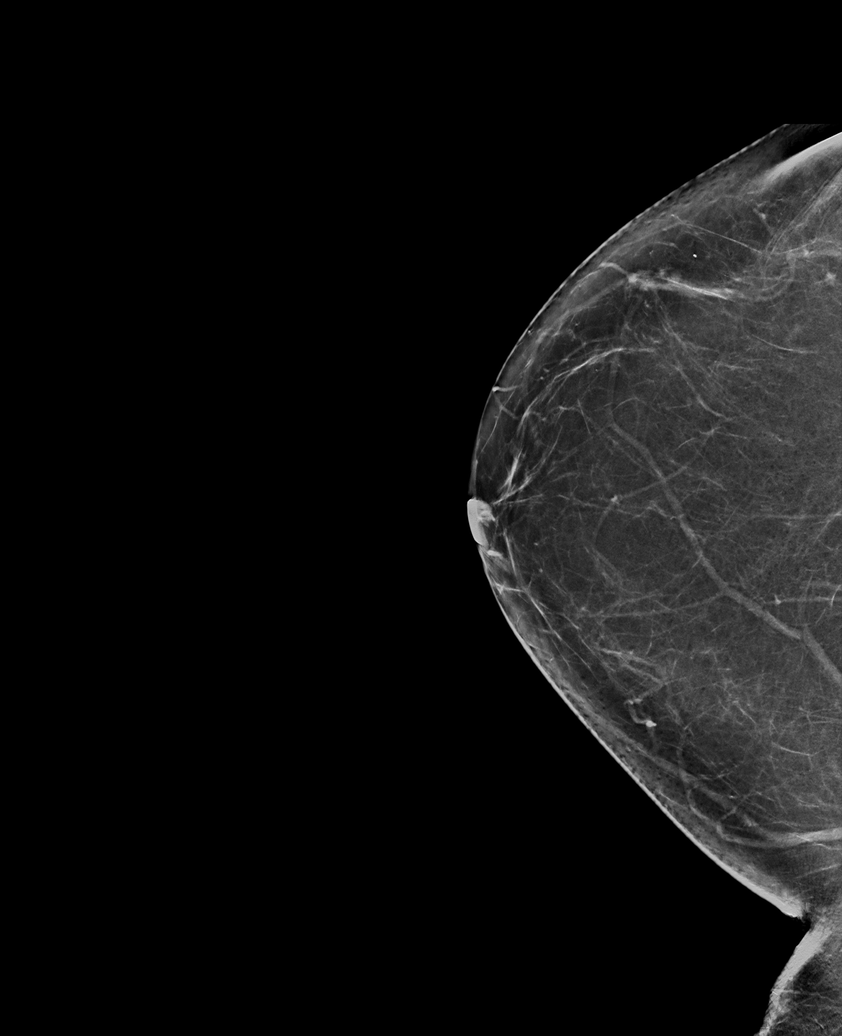

[R CC synth-2D]
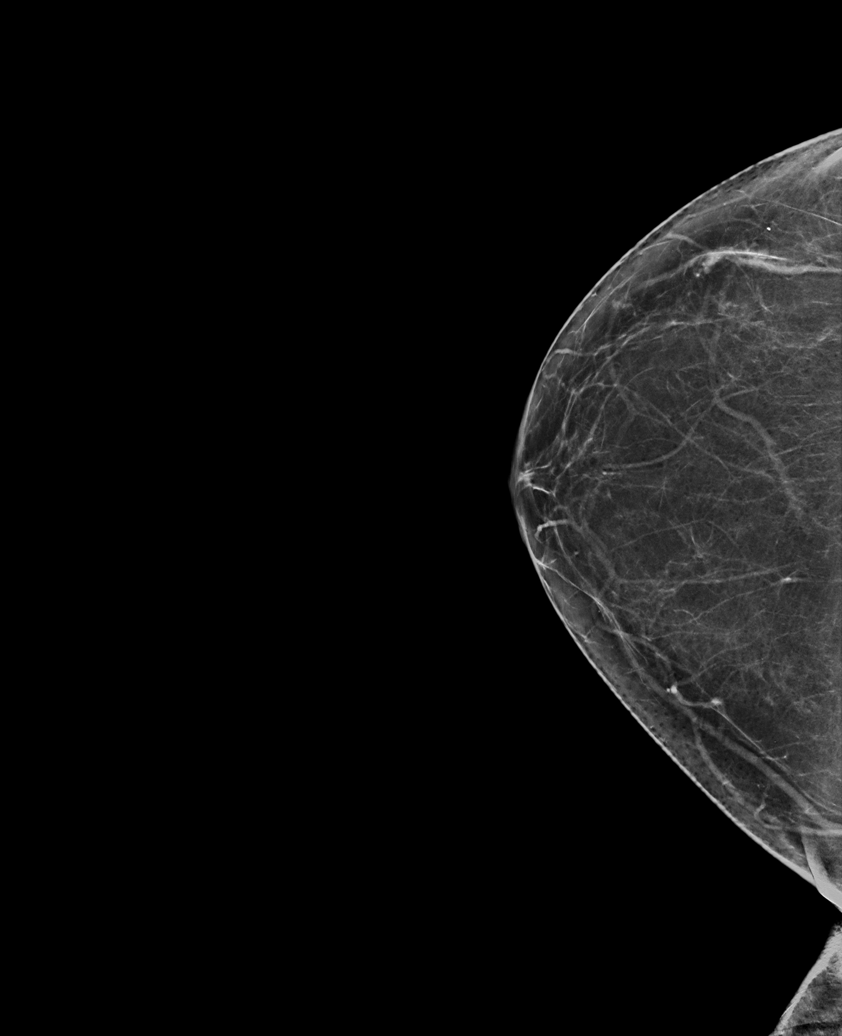

[R MLO tomo · tomo slice 37/74.0]
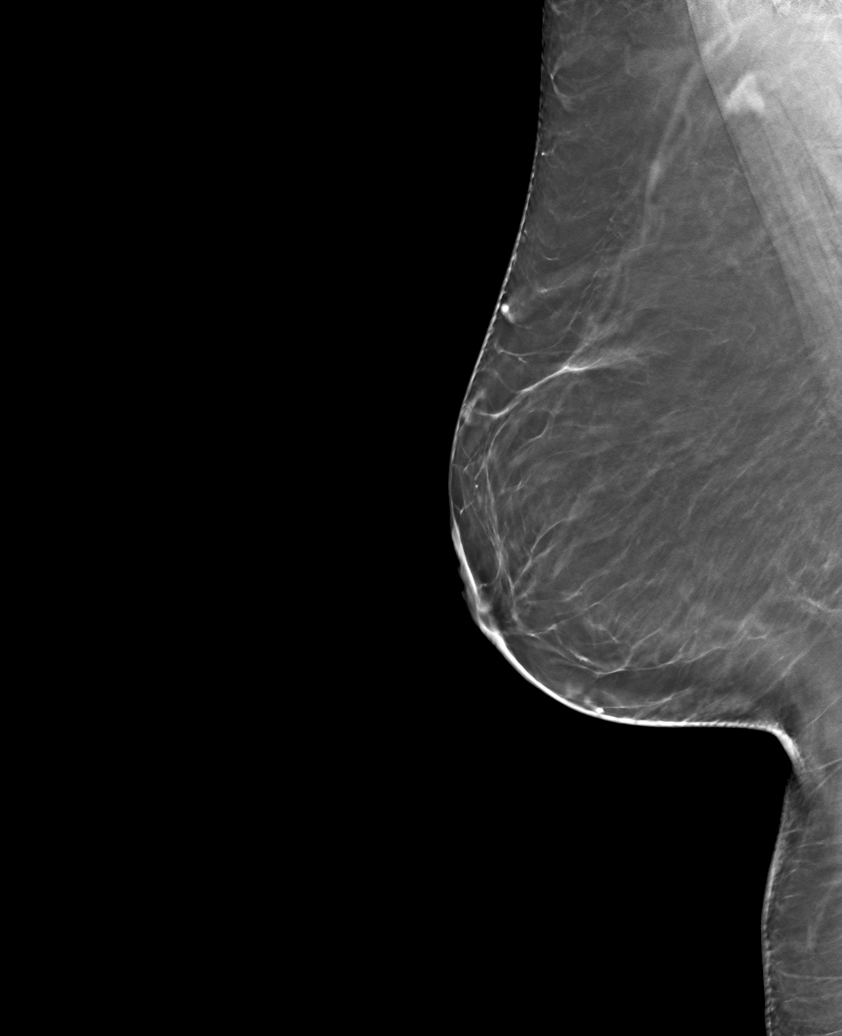

[6 of 30 positions shown; findings below may reference images not displayed]

FINDINGS: There are no findings suspicious for malignancy.
IMPRESSION: No mammographic evidence of malignancy. A result letter of this
screening mammogram will be mailed directly to the patient.

RECOMMENDATION:
Screening mammogram in one year. (Code:0E-3-N98)

BI-RADS CATEGORY  1: Negative.

## 2022-11-22 ENCOUNTER — Encounter: Payer: Self-pay | Admitting: Family Medicine

## 2022-11-25 ENCOUNTER — Ambulatory Visit (INDEPENDENT_AMBULATORY_CARE_PROVIDER_SITE_OTHER): Payer: Medicare PPO | Admitting: Family Medicine

## 2022-11-25 ENCOUNTER — Encounter: Payer: Self-pay | Admitting: Family Medicine

## 2022-11-25 VITALS — BP 138/86 | HR 62 | Temp 97.8°F | Resp 18 | Ht 63.0 in | Wt 259.5 lb

## 2022-11-25 DIAGNOSIS — Z23 Encounter for immunization: Secondary | ICD-10-CM

## 2022-11-25 DIAGNOSIS — I890 Lymphedema, not elsewhere classified: Secondary | ICD-10-CM | POA: Insufficient documentation

## 2022-11-25 DIAGNOSIS — E785 Hyperlipidemia, unspecified: Secondary | ICD-10-CM

## 2022-11-25 LAB — BASIC METABOLIC PANEL
BUN: 14 mg/dL (ref 6–23)
CO2: 35 mEq/L — ABNORMAL HIGH (ref 19–32)
Calcium: 9.1 mg/dL (ref 8.4–10.5)
Chloride: 101 mEq/L (ref 96–112)
Creatinine, Ser: 0.54 mg/dL (ref 0.40–1.20)
GFR: 96.74 mL/min (ref >60.00)
Glucose, Bld: 92 mg/dL (ref 70–99)
Potassium: 4.6 mEq/L (ref 3.5–5.1)
Sodium: 142 mEq/L (ref 135–145)

## 2022-11-25 LAB — HEPATIC FUNCTION PANEL
ALT: 25 U/L (ref 0–35)
AST: 21 U/L (ref 0–37)
Albumin: 4.1 g/dL (ref 3.5–5.2)
Alkaline Phosphatase: 60 U/L (ref 39–117)
Bilirubin, Direct: 0.1 mg/dL (ref 0.0–0.3)
Total Bilirubin: 0.7 mg/dL (ref 0.2–1.2)
Total Protein: 6.5 g/dL (ref 6.0–8.3)

## 2022-11-25 LAB — CBC WITH DIFFERENTIAL/PLATELET
Basophils Absolute: 0 10*3/uL (ref 0.0–0.1)
Basophils Relative: 1 % (ref 0.0–3.0)
Eosinophils Absolute: 0.1 10*3/uL (ref 0.0–0.7)
Eosinophils Relative: 1.7 % (ref 0.0–5.0)
HCT: 44.5 % (ref 36.0–46.0)
Hemoglobin: 14.5 g/dL (ref 12.0–15.0)
Lymphocytes Relative: 26.7 % (ref 12.0–46.0)
Lymphs Abs: 1.1 10*3/uL (ref 0.7–4.0)
MCHC: 32.6 g/dL (ref 30.0–36.0)
MCV: 91.7 fl (ref 78.0–100.0)
Monocytes Absolute: 0.3 10*3/uL (ref 0.1–1.0)
Monocytes Relative: 6.3 % (ref 3.0–12.0)
Neutro Abs: 2.7 10*3/uL (ref 1.4–7.7)
Neutrophils Relative %: 64.3 % (ref 43.0–77.0)
Platelets: 168 10*3/uL (ref 150.0–400.0)
RBC: 4.85 Mil/uL (ref 3.87–5.11)
RDW: 13.9 % (ref 11.5–15.5)
WBC: 4.3 10*3/uL (ref 4.0–10.5)

## 2022-11-25 LAB — LIPID PANEL
Cholesterol: 192 mg/dL (ref 0–200)
HDL: 74.9 mg/dL (ref >39.00)
LDL Cholesterol: 102 mg/dL — ABNORMAL HIGH (ref 0–99)
NonHDL: 117.13
Total CHOL/HDL Ratio: 3
Triglycerides: 74 mg/dL (ref 0.0–149.0)
VLDL: 14.8 mg/dL (ref 0.0–40.0)

## 2022-11-25 LAB — TSH: TSH: 1.54 u[IU]/mL (ref 0.35–5.50)

## 2022-11-25 LAB — HEMOGLOBIN A1C: Hgb A1c MFr Bld: 5.7 % (ref 4.6–6.5)

## 2022-11-25 NOTE — Progress Notes (Unsigned)
   Subjective:    Patient ID: Mary Montgomery, female    DOB: 09-Jan-1956, 67 y.o.   MRN: [id number]  HPI Hyperlipidemia- chronic problem, on Rosuvastatin 10mg  daily.  No CP, SOB, abd pain, N/V.  Obesity- current BMI 45.97.  Limited in ability to exercise due to lymphedema.  Pt is planning to start [employer].  Did not have success w/ [employer].  Lymphedema- legs bilaterally.  Wears compression hose daily.  Wears open toed, knee high stockings.  Pt reports hose provide considerable benefit- even improves knee condition and provides stability.  Needs new pair as current pair has stretched.   Review of Systems For ROS see HPI     Objective:   Physical Exam Vitals reviewed.  Constitutional:      General: She is not in acute distress.    Appearance: Normal appearance. She is well-developed. She is obese. She is not ill-appearing.  HENT:     Head: Normocephalic and atraumatic.  Eyes:     Conjunctiva/sclera: Conjunctivae normal.     Pupils: Pupils are equal, round, and reactive to light.  Neck:     Thyroid: No thyromegaly.  Cardiovascular:     Rate and Rhythm: Normal rate and regular rhythm.     Pulses: Normal pulses.     Heart sounds: Normal heart sounds. No murmur heard. Pulmonary:     Effort: Pulmonary effort is normal. No respiratory distress.     Breath sounds: Normal breath sounds.  Abdominal:     General: There is no distension.     Palpations: Abdomen is soft.     Tenderness: There is no abdominal tenderness.  Musculoskeletal:     Cervical back: Normal range of motion and neck supple.     Right lower leg: Edema (lymphedema) present.     Left lower leg: Edema (lymphedema) present.  Lymphadenopathy:     Cervical: No cervical adenopathy.  Skin:    General: Skin is warm and dry.  Neurological:     General: No focal deficit present.     Mental Status: She is alert and oriented to person, place, and time.  Psychiatric:        Mood and Affect: Mood normal.         Behavior: Behavior normal.        Thought Content: Thought content normal.           Assessment & Plan:

## 2022-11-25 NOTE — Patient Instructions (Signed)
Follow up as needed or as scheduled We'll notify you of your lab results and make any changes if needed Continue to work on low carb diet and be as active as possible Call with any questions or concerns Have a great weekend!!

## 2022-11-28 ENCOUNTER — Telehealth: Payer: Self-pay

## 2022-11-28 NOTE — Telephone Encounter (Signed)
Pt seen results on my chart.

## 2022-11-28 NOTE — Telephone Encounter (Signed)
-----   Message from Neena Rhymes sent at 11/28/2022  3:52 PM EDT ----- Labs look great!  No changes at this time

## 2022-11-29 ENCOUNTER — Telehealth: Payer: Self-pay | Admitting: Family Medicine

## 2022-11-29 NOTE — Assessment & Plan Note (Signed)
Ongoing issue for pt.  She wears compression hose daily and is in need of new ones as current are stretched out and not effective.  Will send prescription to her DME company.  Pt appreciative.

## 2022-11-29 NOTE — Telephone Encounter (Signed)
Received forms from [employer] & placed in provider bin

## 2022-11-29 NOTE — Assessment & Plan Note (Signed)
Current BMI is 45.97  She is planning to start [employer].  She did not have success w/ [employer].  Weight loss is difficult as she is not able to exercise due to lymphedema.  Will continue to follow along.  Check labs to risk stratify.

## 2022-11-29 NOTE — Assessment & Plan Note (Signed)
Chronic problem.  Currently on Crestor 10mg  daily w/o difficulty.  Not able to exercise due to lymphedema.  Check labs.  Adjust meds prn

## 2022-11-30 NOTE — Telephone Encounter (Signed)
Form signed and returned to [facility] 

## 2022-11-30 NOTE — Telephone Encounter (Signed)
Form faxed and placed in scan  

## 2022-11-30 NOTE — Telephone Encounter (Signed)
Forms placed in Dr Tabori to be signed folder  

## 2023-01-19 ENCOUNTER — Ambulatory Visit (INDEPENDENT_AMBULATORY_CARE_PROVIDER_SITE_OTHER): Payer: Medicare PPO | Admitting: Family Medicine

## 2023-01-19 VITALS — BP 130/82 | HR 75 | Temp 97.9°F | Ht 63.0 in | Wt 264.5 lb

## 2023-01-19 DIAGNOSIS — Z0000 Encounter for general adult medical examination without abnormal findings: Secondary | ICD-10-CM | POA: Diagnosis not present

## 2023-01-19 NOTE — Assessment & Plan Note (Signed)
Pt's PE WNL w/ exception of obesity.  UTD on mammo, pap, cologuard, Tdap, PNA.  Declines DEXA.  UTD on flu.  Reviewed labs from August visit- no need to repeat.  Anticipatory guidance provided.

## 2023-01-19 NOTE — Progress Notes (Signed)
   Subjective:    Patient ID: Mary Montgomery, female    DOB: 1955/09/26, 67 y.o.   MRN: [id number]  HPI CPE- UTD on mammo, pap, cologuard, Tdap, PNA.  Had COVID and flu shots at [facility].  Pt declines DEXA  Patient Care Team    Relationship Specialty Notifications Start End  Sheliah Hatch, MD PCP - General Family Medicine  10/07/13   Gaylord Shih, [facility]  Specialist  01/19/23   Lonn Georgia, OD  Optometry  01/19/23     Health Maintenance  Topic Date Due   Medicare Annual Wellness (AWV)  Never done   HIV Screening  Never done   DEXA SCAN  Never done   INFLUENZA VACCINE  11/03/2022   COVID-19 Vaccine (7 - 2023-24 season) 12/04/2022   MAMMOGRAM  08/19/2023   Cervical Cancer Screening (HPV/Pap Cotest)  02/13/2024   Fecal DNA (Cologuard)  09/30/2024   DTaP/Tdap/Td (3 - Td or Tdap) 09/17/2031   Pneumonia Vaccine 59+ Years old  Completed   Hepatitis C Screening  Completed   Zoster Vaccines- Shingrix  Completed   HPV VACCINES  Aged Out   Colonoscopy  Discontinued      Review of Systems Patient reports no vision/ hearing changes, adenopathy,fever, weight change,  persistant/recurrent hoarseness , swallowing issues, chest pain, palpitations, edema, persistant/recurrent cough, hemoptysis, dyspnea (rest/exertional/paroxysmal nocturnal), gastrointestinal bleeding (melena, rectal bleeding), abdominal pain, significant heartburn, bowel changes, GU symptoms (dysuria, hematuria, incontinence), Gyn symptoms (abnormal  bleeding, pain),  syncope, focal weakness, memory loss, numbness & tingling, skin/hair/nail changes, abnormal bruising or bleeding, anxiety, or depression.     Objective:   Physical Exam General Appearance:    Alert, cooperative, no distress, appears stated age, obese  Head:    Normocephalic, without obvious abnormality, atraumatic  Eyes:    PERRL, conjunctiva/corneas clear, EOM's intact both eyes  Ears:    Normal TM's and external ear canals, both ears  Nose:   Nares normal, septum  midline, mucosa normal, no drainage    or sinus tenderness  Throat:   Lips, mucosa, and tongue normal; teeth and gums normal  Neck:   Supple, symmetrical, trachea midline, no adenopathy;    Thyroid: no enlargement/tenderness/nodules  Back:     Symmetric, no curvature, ROM normal, no CVA tenderness  Lungs:     Clear to auscultation bilaterally, respirations unlabored  Chest Wall:    No tenderness or deformity   Heart:    Regular rate and rhythm, S1 and S2 normal, no murmur, rub   or gallop  Breast Exam:    Deferred to mammo  Abdomen:     Soft, non-tender, bowel sounds active all four quadrants,    no masses, no organomegaly  Genitalia:    Deferred  Rectal:    Extremities:   Extremities normal, atraumatic, no cyanosis or edema  Pulses:   2+ and symmetric all extremities  Skin:   Skin color, texture, turgor normal, no rashes or lesions  Lymph nodes:   Cervical, supraclavicular, and axillary nodes normal  Neurologic:   CNII-XII intact, normal strength, sensation and reflexes    throughout          Assessment & Plan:

## 2023-01-19 NOTE — Patient Instructions (Signed)
Follow up in 6 months to recheck cholesterol We'll notify you of your lab results and make any changes if needed Continue to work on healthy diet and regular exercise- you can do it!! Call with any questions or concerns Stay Safe!  Stay Healthy! Happy Fall!!

## 2023-01-27 ENCOUNTER — Other Ambulatory Visit: Payer: Self-pay

## 2023-01-27 DIAGNOSIS — E785 Hyperlipidemia, unspecified: Secondary | ICD-10-CM

## 2023-01-27 MED ORDER — ROSUVASTATIN CALCIUM 10 MG PO TABS: ORAL_TABLET | ORAL | 3 refills | Status: DC

## 2023-03-04 DIAGNOSIS — E669 Obesity, unspecified: Secondary | ICD-10-CM | POA: Diagnosis not present

## 2023-03-04 DIAGNOSIS — Z6841 Body Mass Index (BMI) 40.0 and over, adult: Secondary | ICD-10-CM | POA: Diagnosis not present

## 2023-04-03 DIAGNOSIS — E669 Obesity, unspecified: Secondary | ICD-10-CM | POA: Diagnosis not present

## 2023-04-03 DIAGNOSIS — Z6841 Body Mass Index (BMI) 40.0 and over, adult: Secondary | ICD-10-CM | POA: Diagnosis not present

## 2023-05-04 DIAGNOSIS — E669 Obesity, unspecified: Secondary | ICD-10-CM | POA: Diagnosis not present

## 2023-05-04 DIAGNOSIS — Z6841 Body Mass Index (BMI) 40.0 and over, adult: Secondary | ICD-10-CM | POA: Diagnosis not present

## 2023-06-02 DIAGNOSIS — Z6841 Body Mass Index (BMI) 40.0 and over, adult: Secondary | ICD-10-CM | POA: Diagnosis not present

## 2023-06-02 DIAGNOSIS — E669 Obesity, unspecified: Secondary | ICD-10-CM | POA: Diagnosis not present

## 2023-07-03 DIAGNOSIS — Z6841 Body Mass Index (BMI) 40.0 and over, adult: Secondary | ICD-10-CM | POA: Diagnosis not present

## 2023-07-03 DIAGNOSIS — E669 Obesity, unspecified: Secondary | ICD-10-CM | POA: Diagnosis not present

## 2023-07-06 DIAGNOSIS — M1711 Unilateral primary osteoarthritis, right knee: Secondary | ICD-10-CM | POA: Diagnosis not present

## 2023-07-13 DIAGNOSIS — M1711 Unilateral primary osteoarthritis, right knee: Secondary | ICD-10-CM | POA: Diagnosis not present

## 2023-07-20 ENCOUNTER — Ambulatory Visit: Payer: Medicare PPO | Admitting: Family Medicine

## 2023-07-20 DIAGNOSIS — M1711 Unilateral primary osteoarthritis, right knee: Secondary | ICD-10-CM | POA: Diagnosis not present

## 2023-08-01 ENCOUNTER — Encounter: Payer: Self-pay | Admitting: Family Medicine

## 2023-08-01 ENCOUNTER — Ambulatory Visit: Payer: Medicare PPO | Admitting: Family Medicine

## 2023-08-01 VITALS — BP 128/70 | HR 61 | Temp 98.3°F | Ht 63.0 in | Wt 262.6 lb

## 2023-08-01 DIAGNOSIS — Z0000 Encounter for general adult medical examination without abnormal findings: Secondary | ICD-10-CM | POA: Diagnosis not present

## 2023-08-01 DIAGNOSIS — Z780 Asymptomatic menopausal state: Secondary | ICD-10-CM | POA: Diagnosis not present

## 2023-08-01 DIAGNOSIS — E559 Vitamin D deficiency, unspecified: Secondary | ICD-10-CM

## 2023-08-01 LAB — CBC WITH DIFFERENTIAL/PLATELET
Basophils Absolute: 0 10*3/uL (ref 0.0–0.1)
Basophils Relative: 0.7 % (ref 0.0–3.0)
Eosinophils Absolute: 0.1 10*3/uL (ref 0.0–0.7)
Eosinophils Relative: 1.3 % (ref 0.0–5.0)
HCT: 46.1 % — ABNORMAL HIGH (ref 36.0–46.0)
Hemoglobin: 15.2 g/dL — ABNORMAL HIGH (ref 12.0–15.0)
Lymphocytes Relative: 19.9 % (ref 12.0–46.0)
Lymphs Abs: 1.1 10*3/uL (ref 0.7–4.0)
MCHC: 33 g/dL (ref 30.0–36.0)
MCV: 92.2 fl (ref 78.0–100.0)
Monocytes Absolute: 0.4 10*3/uL (ref 0.1–1.0)
Monocytes Relative: 6.4 % (ref 3.0–12.0)
Neutro Abs: 4.1 10*3/uL (ref 1.4–7.7)
Neutrophils Relative %: 71.7 % (ref 43.0–77.0)
Platelets: 173 10*3/uL (ref 150.0–400.0)
RBC: 5 Mil/uL (ref 3.87–5.11)
RDW: 14.4 % (ref 11.5–15.5)
WBC: 5.7 10*3/uL (ref 4.0–10.5)

## 2023-08-01 LAB — BASIC METABOLIC PANEL WITH GFR
BUN: 17 mg/dL (ref 6–23)
CO2: 33 meq/L — ABNORMAL HIGH (ref 19–32)
Calcium: 9.4 mg/dL (ref 8.4–10.5)
Chloride: 100 meq/L (ref 96–112)
Creatinine, Ser: 0.55 mg/dL (ref 0.40–1.20)
GFR: 95.85 mL/min (ref >60.00)
Glucose, Bld: 96 mg/dL (ref 70–99)
Potassium: 4.2 meq/L (ref 3.5–5.1)
Sodium: 139 meq/L (ref 135–145)

## 2023-08-01 LAB — HEPATIC FUNCTION PANEL
ALT: 22 U/L (ref 0–35)
AST: 21 U/L (ref 0–37)
Albumin: 4.3 g/dL (ref 3.5–5.2)
Alkaline Phosphatase: 50 U/L (ref 39–117)
Bilirubin, Direct: 0.1 mg/dL (ref 0.0–0.3)
Total Bilirubin: 0.6 mg/dL (ref 0.2–1.2)
Total Protein: 7.1 g/dL (ref 6.0–8.3)

## 2023-08-01 LAB — LIPID PANEL
Cholesterol: 183 mg/dL (ref 0–200)
HDL: 80.1 mg/dL (ref >39.00)
LDL Cholesterol: 91 mg/dL (ref 0–99)
NonHDL: 102.78
Total CHOL/HDL Ratio: 2
Triglycerides: 60 mg/dL (ref 0.0–149.0)
VLDL: 12 mg/dL (ref 0.0–40.0)

## 2023-08-01 LAB — HEMOGLOBIN A1C: Hgb A1c MFr Bld: 5.8 % (ref 4.6–6.5)

## 2023-08-01 NOTE — Patient Instructions (Signed)
 Follow up in 6 months to recheck cholesterol We'll notify you of your lab results and make any changes if needed Continue to work on healthy diet and regular exercise- you're doing it!! [employer] and ask if you have a weight loss benefit.  This could mean medication, surgery, etc.  If you do have a weight loss benefit, ask specifically about Wegovy and Zepbound Call with any questions or concerns Stay Safe!  Stay Healthy! Happy Early [place]!!

## 2023-08-01 NOTE — Assessment & Plan Note (Signed)
 Pt's PE WNL w/ exception of BMI and known LE edema.  UTD on mammo, pap, cologuard, Tdap, PNA.  Due for DEXA- ordered.  Check labs.  Anticipatory guidance provided.

## 2023-08-01 NOTE — Progress Notes (Signed)
 Subjective:    Patient ID: Mary Montgomery, female    DOB: 1956/02/10, 68 y.o.   MRN: [id number]  HPI Here today for CPE.  Risk Factors: Hyperlipidemia- chronic problem, on Crestor  10mg  daily Morbid obesity- BMI 46.52 Physical Activity: does exercise class 3x/week Fall Risk: low Depression: none Hearing: normal to conversational tones ADL's: independent Cognitive: normal linear thought process, memory and attention intact Home Safety: lives w/ husband, feels safe Height, Weight, BMI, Visual Acuity: see vitals, vision corrected to 20/20 w/ glasses Counseling: Due for DEXA.  UTD on mammo, pap, cologuard, Tdap, PNA Healthcare POA/living will: pt has both Labs Ordered: See A&P Care Plan: See A&P   Risk of addiction- low.  Does not use controlled substances, rarely drinks  Health Maintenance  Topic Date Due   Medicare Annual Wellness (AWV)  Never done   HIV Screening  Never done   DEXA SCAN  Never done   COVID-19 Vaccine (8 - 2024-25 season) 02/27/2023   MAMMOGRAM  08/19/2023   INFLUENZA VACCINE  11/03/2023   Cervical Cancer Screening (HPV/Pap Cotest)  02/13/2024   Fecal DNA (Cologuard)  09/30/2024   DTaP/Tdap/Td (3 - Td or Tdap) 09/17/2031   Pneumonia Vaccine 13+ Years old  Completed   Hepatitis C Screening  Completed   Zoster Vaccines- Shingrix  Completed   HPV VACCINES  Aged Out   Meningococcal B Vaccine  Aged Out   Colonoscopy  Discontinued    Patient Care Team    Relationship Specialty Notifications Start End  Jess Morita, MD PCP - General Family Medicine  10/07/13   Ortho, [facility]  Specialist  01/19/23   Lannis Plain, OD  Optometry  01/19/23       Review of Systems Patient reports no hearing changes, adenopathy,fever, weight change,  persistant/recurrent hoarseness , swallowing issues, chest pain, palpitations, edema, persistant/recurrent cough, hemoptysis, dyspnea (rest/exertional/paroxysmal nocturnal), gastrointestinal bleeding (melena, rectal  bleeding), abdominal pain, significant heartburn, bowel changes, GU symptoms (dysuria, hematuria, incontinence), Gyn symptoms (abnormal  bleeding, pain),  syncope, focal weakness, memory loss, numbness & tingling, skin/hair/nail changes, abnormal bruising or bleeding, anxiety, or depression.   + vision changes- has cataract forming    Objective:   Physical Exam General Appearance:    Alert, cooperative, no distress, appears stated age, obese  Head:    Normocephalic, without obvious abnormality, atraumatic  Eyes:    PERRL, conjunctiva/corneas clear, EOM's intact both eyes  Ears:    Normal TM's and external ear canals, both ears  Nose:   Nares normal, septum midline, mucosa normal, no drainage    or sinus tenderness  Throat:   Lips, mucosa, and tongue normal; teeth and gums normal  Neck:   Supple, symmetrical, trachea midline, no adenopathy;    Thyroid : no enlargement/tenderness/nodules  Back:     Symmetric, no curvature, ROM normal, no CVA tenderness  Lungs:     Clear to auscultation bilaterally, respirations unlabored  Chest Wall:    No tenderness or deformity   Heart:    Regular rate and rhythm, S1 and S2 normal, no murmur, rub   or gallop  Breast Exam:    Deferred to mammo  Abdomen:     Soft, non-tender, bowel sounds active all four quadrants,    no masses, no organomegaly  Genitalia:    Deferred to GYN  Rectal:    Extremities:   Bilateral LE edema  Pulses:   2+ and symmetric all extremities  Skin:   Skin color, texture, turgor normal,  no rashes or lesions  Lymph nodes:   Cervical, supraclavicular, and axillary nodes normal  Neurologic:   CNII-XII intact, normal strength, sensation and reflexes    throughout          Assessment & Plan:

## 2023-08-02 ENCOUNTER — Encounter: Payer: Self-pay | Admitting: Family Medicine

## 2023-08-02 DIAGNOSIS — Z6841 Body Mass Index (BMI) 40.0 and over, adult: Secondary | ICD-10-CM | POA: Diagnosis not present

## 2023-08-02 DIAGNOSIS — E669 Obesity, unspecified: Secondary | ICD-10-CM | POA: Diagnosis not present

## 2023-08-02 LAB — TSH: TSH: 1.72 u[IU]/mL (ref 0.35–5.50)

## 2023-08-02 LAB — VITAMIN D 25 HYDROXY (VIT D DEFICIENCY, FRACTURES): VITD: 31.64 ng/mL (ref 30.00–100.00)

## 2023-08-23 ENCOUNTER — Telehealth: Payer: Self-pay

## 2023-08-23 NOTE — Telephone Encounter (Signed)
 Type of form received: detailed written order    Additional comments:    Received by: Rebeca Camps should be Faxed/mailed to: (address/ fax #)  Is patient requesting call for pickup: fax  Form placed:  in provider sign folder   Attach charge sheet.  Provider will determine charge.  Individual made aware of 3-5 business day turn around No?

## 2023-08-25 NOTE — Telephone Encounter (Signed)
 Left with front desk to be faxed.

## 2023-08-25 NOTE — Telephone Encounter (Signed)
 Form signed and returned to [employer]

## 2023-08-31 DIAGNOSIS — L720 Epidermal cyst: Secondary | ICD-10-CM | POA: Diagnosis not present

## 2023-08-31 DIAGNOSIS — L570 Actinic keratosis: Secondary | ICD-10-CM | POA: Diagnosis not present

## 2023-08-31 DIAGNOSIS — L820 Inflamed seborrheic keratosis: Secondary | ICD-10-CM | POA: Diagnosis not present

## 2023-08-31 DIAGNOSIS — L738 Other specified follicular disorders: Secondary | ICD-10-CM | POA: Diagnosis not present

## 2023-09-01 ENCOUNTER — Emergency Department (HOSPITAL_BASED_OUTPATIENT_CLINIC_OR_DEPARTMENT_OTHER)

## 2023-09-01 ENCOUNTER — Ambulatory Visit
Admission: EM | Admit: 2023-09-01 | Discharge: 2023-09-01 | Disposition: A | Discharge status: Home / Self Care | Attending: Family Medicine | Admitting: Family Medicine

## 2023-09-01 ENCOUNTER — Other Ambulatory Visit: Payer: Self-pay

## 2023-09-01 ENCOUNTER — Emergency Department (HOSPITAL_BASED_OUTPATIENT_CLINIC_OR_DEPARTMENT_OTHER)
Admission: EM | Admit: 2023-09-01 | Discharge: 2023-09-01 | Disposition: A | Discharge status: Home / Self Care | Attending: Emergency Medicine | Admitting: Emergency Medicine

## 2023-09-01 ENCOUNTER — Ambulatory Visit: Payer: Self-pay

## 2023-09-01 ENCOUNTER — Encounter (HOSPITAL_BASED_OUTPATIENT_CLINIC_OR_DEPARTMENT_OTHER): Payer: Self-pay | Admitting: Emergency Medicine

## 2023-09-01 DIAGNOSIS — I4510 Unspecified right bundle-branch block: Secondary | ICD-10-CM | POA: Diagnosis not present

## 2023-09-01 DIAGNOSIS — R079 Chest pain, unspecified: Secondary | ICD-10-CM

## 2023-09-01 DIAGNOSIS — R9431 Abnormal electrocardiogram [ECG] [EKG]: Secondary | ICD-10-CM | POA: Diagnosis not present

## 2023-09-01 DIAGNOSIS — I10 Essential (primary) hypertension: Secondary | ICD-10-CM | POA: Diagnosis not present

## 2023-09-01 DIAGNOSIS — R0789 Other chest pain: Secondary | ICD-10-CM | POA: Diagnosis not present

## 2023-09-01 LAB — COMPREHENSIVE METABOLIC PANEL WITH GFR
ALT: 18 U/L (ref 0–44)
AST: 22 U/L (ref 15–41)
Albumin: 4.1 g/dL (ref 3.5–5.0)
Alkaline Phosphatase: 63 U/L (ref 38–126)
Anion gap: 10 (ref 5–15)
BUN: 13 mg/dL (ref 8–23)
CO2: 28 mmol/L (ref 22–32)
Calcium: 9.3 mg/dL (ref 8.9–10.3)
Chloride: 104 mmol/L (ref 98–111)
Creatinine, Ser: 0.51 mg/dL (ref 0.44–1.00)
GFR, Estimated: >60 mL/min (ref >60)
Glucose, Bld: 85 mg/dL (ref 70–99)
Potassium: 4.1 mmol/L (ref 3.5–5.1)
Sodium: 142 mmol/L (ref 135–145)
Total Bilirubin: 0.5 mg/dL (ref 0.0–1.2)
Total Protein: 6.8 g/dL (ref 6.5–8.1)

## 2023-09-01 LAB — CBC
HCT: 42.9 % (ref 36.0–46.0)
Hemoglobin: 14.3 g/dL (ref 12.0–15.0)
MCH: 30.4 pg (ref 26.0–34.0)
MCHC: 33.3 g/dL (ref 30.0–36.0)
MCV: 91.3 fL (ref 80.0–100.0)
Platelets: 183 10*3/uL (ref 150–400)
RBC: 4.7 MIL/uL (ref 3.87–5.11)
RDW: 13.5 % (ref 11.5–15.5)
WBC: 6.2 10*3/uL (ref 4.0–10.5)
nRBC: 0 % (ref 0.0–0.2)

## 2023-09-01 LAB — TROPONIN T, HIGH SENSITIVITY: Troponin T High Sensitivity: <15 ng/L (ref <19)

## 2023-09-01 NOTE — ED Provider Notes (Signed)
 [facility] Provider Note   CSN: [id number] Arrival date & time: 09/01/23  1233     History  Chief Complaint  Patient presents with   Chest Pain    Mary Montgomery is a 68 y.o. female.  With a history of hypertension hyperlipidemia presents to the ED for chest pain.  2 weeks of intermittent chest pain localized over left side of her chest.  Was seen at urgent care for this reason and sent here for further evaluation given concern for chest pain and EKG changes of new right bundle branch block.  7 aside from chest pain she has no other systemic complaints at this time.  No fevers chills recent illness shortness of breath nausea vomiting or diaphoresis.  No prior history of MI   Chest Pain      Home Medications Prior to Admission medications   Medication Sig Start Date End Date Taking? Authorizing Provider  cetirizine (ZYRTEC) 10 MG tablet Take 10 mg by mouth daily.    [provider]  cholecalciferol (VITAMIN D ) 1000 UNITS tablet Take 1,000 Units by mouth daily.    [provider]  cycloSPORINE (RESTASIS) 0.05 % ophthalmic emulsion 1 drop 2 (two) times daily.    [provider]  meloxicam (MOBIC) 15 MG tablet Take 15 mg by mouth daily as needed. 07/08/21   [provider]  rosuvastatin  (CRESTOR ) 10 MG tablet TAKE 1 TABLET(10 MG) BY MOUTH DAILY 01/27/23   Tabori, Katherine E, MD      Allergies    Patient has no known allergies.    Review of Systems   Review of Systems  Cardiovascular:  Positive for chest pain.    Physical Exam Updated Vital Signs BP (!) 141/81   Pulse (!) 52   Temp 97.9 F (36.6 C) (Oral)   Resp 11   Ht (S) 5\' 3"  (1.6 m)   Wt 119.1 kg   LMP 10/01/2013   SpO2 97%   BMI 46.51 kg/m  Physical Exam Vitals and nursing note reviewed.  HENT:     Head: Normocephalic and atraumatic.  Eyes:     Pupils: Pupils are equal, round, and reactive to light.  Cardiovascular:      Rate and Rhythm: Normal rate and regular rhythm.  Pulmonary:     Effort: Pulmonary effort is normal.     Breath sounds: Normal breath sounds.  Abdominal:     Palpations: Abdomen is soft.     Tenderness: There is no abdominal tenderness.  Skin:    General: Skin is warm and dry.  Neurological:     Mental Status: She is alert.  Psychiatric:        Mood and Affect: Mood normal.     ED Results / Procedures / Treatments   Labs (all labs ordered are listed, but only abnormal results are displayed) Labs Reviewed  CBC  COMPREHENSIVE METABOLIC PANEL WITH GFR  TROPONIN T, HIGH SENSITIVITY    EKG EKG Interpretation Date/Time:  Friday Sep 01 2023 12:45:22 EDT Ventricular Rate:  62 PR Interval:  156 QRS Duration:  144 QT Interval:  463 QTC Calculation: 471 R Axis:   -16  Text Interpretation: Sinus rhythm Right bundle branch block Confirmed by Rafael Bun [phone]) on 09/01/2023 1:20:21 PM  Radiology No results found.  Procedures Procedures    Medications Ordered in ED Medications - No data to display  ED Course/ Medical Decision Making/ A&P Clinical Course as of 09/01/23 1458  Fri Sep 01, 2023  1452 EKG does show right bundle branch block new from previous.  No evidence of ischemia or dysrhythmia.  CMP CBC unremarkable.  With no abnormal values.  Positive troponin less than 15 given reported chest pain for last week very low suspicion for ACS.  No need for delta at this time considering if there were true cardiac insult over 2 weeks troponin should be elevated on the first value.  Chest x-ray looks clear without focal consolidation.  Patient remains hemodynamically stable chest pain-free at this time.  Appropriate for discharge with PCP follow-up and  potential cardiology referral through PCP [MP]    Clinical Course User Index [MP] Sallyanne Creamer, DO                                 Medical Decision Making 68 year old female with history as above presenting to the ED for  2 weeks of chest pain.  No other associated symptoms.  Pain localized over left chest.  Afebrile well-appearing on my exam.  Slightly hypertensive with systolic blood pressure in the 140s.  Seen earlier urgent care and sent here for further evaluation given concern for EKG change (new right bundle block.) along with chest pain.  Benign physical exam.  Differential diagnosis includes ACS, dysrhythmia, pneumonia, anemia electrolyte imbalance.  Low suspicion for PE as she is not having any shortness of breath tachycardia or hypoxia.  Low suspicion for aortic dissection as this has been occurring over the course of 2 weeks and she looks well overall.  Will continue monitor on telemetry  Amount and/or Complexity of Data Reviewed Labs: ordered. Radiology: ordered.           Final Clinical Impression(s) / ED Diagnoses Final diagnoses:  Chest pain, unspecified type    Rx / DC Orders ED Discharge Orders     None         Sallyanne Creamer, DO 09/01/23 1458

## 2023-09-01 NOTE — ED Notes (Signed)
 Patient is being discharged from the Urgent Care and sent to the Emergency Department via POV with spouse . Per M. Ragan, FNP, patient is in need of higher level of care due to need for further cardiac workup. Patient is aware and verbalizes understanding of plan of care.  Vitals:   09/01/23 1149 09/01/23 1150  BP: (!) 147/91 (!) 164/95  Pulse:    Resp:    Temp:    SpO2:

## 2023-09-01 NOTE — ED Notes (Addendum)
 Pt states pain is midsternal and under L breast. Does not otherwise radiate. States pain is dull in nature. Hx of CP that was diagnosed as GERD; pt states pain does not feel the same. Denies n/v, SOB.

## 2023-09-01 NOTE — Discharge Instructions (Addendum)
 You were seen in the emerged part for chest pain Your EKG showed a right bundle branch block (RBBB) This is a common EKG finding although you have never had it on your EKG before At this time there is no evidence you are having a heart attack or other other major abnormalities with your blood work or chest x-ray It is important that you follow-up with your primary care doctor discuss cardiology referral if your symptoms continue Return to the emerged part for severe pain chest pain trouble breathing or any other concerns

## 2023-09-01 NOTE — Discharge Instructions (Addendum)
 Advised patient/husband go to [facility] ED NOW for further evaluation of chest pain, abnormal EKG, and right bundle branch block.

## 2023-09-01 NOTE — ED Provider Notes (Signed)
 [facility]    CSN: [id number] Arrival date & time: 09/01/23  1139      History   Chief Complaint Chief Complaint  Patient presents with   Chest Pain    HPI Mary Montgomery is a 67 y.o. female.   HPI Pleasant 68 year old female presents with intermittent chest pain for 2 weeks.  Reports has been more constant over the past 3 days.  Patient reports taking Mylanta with no relief.  PMH significant for morbid/severe obesity, HTN, and HLD.  Patient is accompanied by her husband this afternoon.  Patient denies lightheadedness dizziness, changes in visual acuity, bilateral leg swelling, presyncope or syncope.  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      Home Medications    Prior to Admission medications   Medication Sig Start Date End Date Taking? Authorizing Provider  cetirizine (ZYRTEC) 10 MG tablet Take 10 mg by mouth daily.    [provider]  cholecalciferol (VITAMIN D ) 1000 UNITS tablet Take 1,000 Units by mouth daily.    [provider]  cycloSPORINE (RESTASIS) 0.05 % ophthalmic emulsion 1 drop 2 (two) times daily.    [provider]  meloxicam (MOBIC) 15 MG tablet Take 15 mg by mouth daily as needed. 07/08/21   [provider]  rosuvastatin  (CRESTOR ) 10 MG tablet TAKE 1 TABLET(10 MG) BY MOUTH DAILY 01/27/23   Jess Morita, MD    Family History Family History  Problem Relation Age of Onset   Diabetes Mother    Hypertension Mother    Hyperlipidemia Mother    Dementia Mother    Parkinsonism Father    Arthritis Father        and father side of family   Coarctation of the aorta Brother    Anxiety disorder Daughter    Diabetes Daughter    Hypertension Daughter    Obesity Daughter     Social History Social History   Tobacco Use   Smoking status: Never   Smokeless tobacco: Never  Substance Use Topics   Alcohol use: No   Drug use: No     Allergies   Patient has no known allergies.   Review of Systems Review of Systems  Cardiovascular:  Positive for chest pain.     Physical Exam Triage Vital Signs ED Triage Vitals  Encounter Vitals Group     BP 09/01/23 1148 (!) 181/100     Systolic BP Percentile --      Diastolic BP Percentile --      Pulse Rate 09/01/23 1148 67     Resp 09/01/23 1148 17  Temp 09/01/23 1148 98.3 F (36.8 C)     Temp Source 09/01/23 1148 Oral     SpO2 09/01/23 1148 95 %     Weight --      Height --      Head Circumference --      Peak Flow --      Pain Score 09/01/23 1145 3     Pain Loc --      Pain Education --      Exclude from Growth Chart --    No data found.  Updated Vital Signs BP (!) 164/95 (BP Location: Right Arm)   Pulse 67   Temp 98.3 F (36.8 C) (Oral)   Resp 17   LMP 10/01/2013   SpO2 95%    Physical Exam Vitals and nursing note reviewed.  Constitutional:      Appearance: Normal appearance. She is obese.  HENT:     Head: Normocephalic and atraumatic.     Mouth/Throat:     Mouth: Mucous membranes are moist.     Pharynx: Oropharynx is clear.  Eyes:     Extraocular Movements: Extraocular movements intact.     Conjunctiva/sclera: Conjunctivae normal.     Pupils: Pupils are equal, round, and reactive to light.   Cardiovascular:     Rate and Rhythm: Normal rate and regular rhythm.     Pulses: Normal pulses.     Heart sounds: Normal heart sounds.  Pulmonary:     Effort: Pulmonary effort is normal.     Breath sounds: Normal breath sounds. No wheezing, rhonchi or rales.  Musculoskeletal:        General: Normal range of motion.     Cervical back: Normal range of motion and neck supple.  Skin:    General: Skin is warm and dry.  Neurological:     General: No focal deficit present.     Mental Status: She is alert and oriented to person, place, and time. Mental status is at baseline.  Psychiatric:        Mood and Affect: Mood normal.        Behavior: Behavior normal.      UC Treatments / Results  Labs (all labs ordered are listed, but only abnormal results are displayed) Labs Reviewed - No data to display  EKG   Radiology No results found.  Procedures Procedures (including critical care time)  Medications Ordered in UC Medications - No data to display  Initial Impression / Assessment and Plan / UC Course  I have reviewed the triage vital signs and the nursing notes.  Pertinent labs & imaging results that were available during my care of the patient were reviewed by me and considered in my medical decision making (see chart for details).     MDM: 1.  Chest pain, unspecified type-EKG revealed above; 2. Abnormal EKG-different in appearance when compared to 01/21/2018; 3.  Right bundle branch block-new onset advised patient/husband to go to [facility] ED now for further evaluation of chest pain, Abnormal EKG, and new onset right bundle branch block.  Patient/husband agreed and verbalized understanding of these instructions and this plan of care today.  Patient discharged home, hemodynamically stable. Final Clinical Impressions(s) / UC Diagnoses   Final diagnoses:  Chest pain, unspecified type  Abnormal EKG  Right bundle branch block     Discharge  Instructions      Advised patient/husband go to [facility] ED NOW for further  evaluation of chest pain, abnormal EKG, and right bundle branch block.   ED Prescriptions   None    PDMP not reviewed this encounter.   Leonides Ramp, FNP 09/01/23 1244

## 2023-09-01 NOTE — ED Triage Notes (Signed)
 Off and on cp x 2 weeks  no nausea  or vomiting no sob  took OTC meds for indigestion no help

## 2023-09-01 NOTE — Telephone Encounter (Signed)
  Chief Complaint: Chest pain Symptoms: left sided chest pain-3 out of 10 Frequency: two weeks Pertinent Negatives: Patient denies SOB, nausea, vomiting Disposition: [x] ED /[] Urgent Care (no appt availability in office) / [] Appointment(In office/virtual)/ []  [facility] [facility]/ [] Home Care/ [] Refused Recommended Disposition /[] [facility]/ []  Follow-up with PCP Additional Notes: patient calling with concerns for left sided chest pain that has been going on for two weeks. Patient endorses pain level of 3 out of 10 currently. Patient states pain is constant and doesn't go away. Patient unsure if this could be acid reflux. Per protocol, patient is recommended to Emergency Department or Urgent Care for evaluation. Patient verbalized understanding and all questions answered.    Copied from CRM [id number]. Topic: Clinical - Red Word Triage >> Sep 01, 2023 11:25 AM [employer] wrote: Red Word that prompted transfer to Nurse Triage: Chest pain, possible gerd. Reason for Disposition  [1] Chest pain lasts > 5 minutes AND [2] occurred in past 3 days (72 hours) (Exception: Feels exactly the same as previously diagnosed heartburn and has accompanying sour taste in mouth.)  Answer Assessment - Initial Assessment Questions 1. LOCATION: "Where does it hurt?"       Left side of chest 2. RADIATION: "Does the pain go anywhere else?" (e.g., into neck, jaw, arms, back)     no 3. ONSET: "When did the chest pain begin?" (Minutes, hours or days)      Started two weeks ago 4. PATTERN: "Does the pain come and go, or has it been constant since it started?"  "Does it get worse with exertion?"      constant 5. DURATION: "How long does it last" (e.g., seconds, minutes, hours)     Patient states it stays 6. SEVERITY: "How bad is the pain?"  (e.g., Scale 1-10; mild, moderate, or severe)    - MILD (1-3): doesn't interfere with normal activities     - MODERATE (4-7): interferes with normal activities or awakens  from sleep    - SEVERE (8-10): excruciating pain, unable to do any normal activities       3 out of 10 7. CARDIAC RISK FACTORS: "Do you have any history of heart problems or risk factors for heart disease?" (e.g., angina, prior heart attack; diabetes, high blood pressure, high cholesterol, smoker, or strong family history of heart disease)     no 8. PULMONARY RISK FACTORS: "Do you have any history of lung disease?"  (e.g., blood clots in lung, asthma, emphysema, birth control pills)     no 9. CAUSE: "What do you think is causing the chest pain?"     Concerned for possible acid reflux 10. OTHER SYMPTOMS: "Do you have any other symptoms?" (e.g., dizziness, nausea, vomiting, sweating, fever, difficulty breathing, cough)       Slight dizziness but not normal  Protocols used: Chest Pain-A-AH

## 2023-09-01 NOTE — ED Triage Notes (Signed)
 Pt c/o intermittent CP x 2 weeks. Has become constant in last 3 days. Mylanta last couple days with no relief.

## 2023-09-03 DIAGNOSIS — E669 Obesity, unspecified: Secondary | ICD-10-CM | POA: Diagnosis not present

## 2023-09-03 DIAGNOSIS — Z6841 Body Mass Index (BMI) 40.0 and over, adult: Secondary | ICD-10-CM | POA: Diagnosis not present

## 2023-09-04 ENCOUNTER — Other Ambulatory Visit

## 2023-09-04 ENCOUNTER — Inpatient Hospital Stay (HOSPITAL_BASED_OUTPATIENT_CLINIC_OR_DEPARTMENT_OTHER): Admission: RE | Admit: 2023-09-04 | Source: Ambulatory Visit

## 2023-09-05 ENCOUNTER — Other Ambulatory Visit: Payer: Self-pay

## 2023-09-05 ENCOUNTER — Ambulatory Visit (INDEPENDENT_AMBULATORY_CARE_PROVIDER_SITE_OTHER): Admitting: Family Medicine

## 2023-09-05 VITALS — BP 120/80 | HR 74 | Temp 97.9°F | Ht 63.0 in | Wt 264.2 lb

## 2023-09-05 DIAGNOSIS — M722 Plantar fascial fibromatosis: Secondary | ICD-10-CM | POA: Insufficient documentation

## 2023-09-05 DIAGNOSIS — I10 Essential (primary) hypertension: Secondary | ICD-10-CM | POA: Insufficient documentation

## 2023-09-05 DIAGNOSIS — I4510 Unspecified right bundle-branch block: Secondary | ICD-10-CM

## 2023-09-05 DIAGNOSIS — J301 Allergic rhinitis due to pollen: Secondary | ICD-10-CM | POA: Insufficient documentation

## 2023-09-05 DIAGNOSIS — R079 Chest pain, unspecified: Secondary | ICD-10-CM

## 2023-09-05 DIAGNOSIS — R0989 Other specified symptoms and signs involving the circulatory and respiratory systems: Secondary | ICD-10-CM

## 2023-09-05 DIAGNOSIS — M1990 Unspecified osteoarthritis, unspecified site: Secondary | ICD-10-CM | POA: Insufficient documentation

## 2023-09-05 DIAGNOSIS — F419 Anxiety disorder, unspecified: Secondary | ICD-10-CM | POA: Diagnosis not present

## 2023-09-05 DIAGNOSIS — E785 Hyperlipidemia, unspecified: Secondary | ICD-10-CM | POA: Insufficient documentation

## 2023-09-05 MED ORDER — ALPRAZOLAM 0.5 MG PO TABS: 0.5000 mg | ORAL_TABLET | Freq: Two times a day (BID) | ORAL | 1 refills | Status: DC | PRN

## 2023-09-05 NOTE — Progress Notes (Signed)
   Subjective:    Patient ID: Mary Montgomery, female    DOB: 11/09/1955, 68 y.o.   MRN: [id number]  HPI ER f/u- pt went to UC on 5/30 w/ CP and was sent to the ER for additional evaluation.  At that time, she had 2 weeks of intermittent CP and EKG showed new RBBB.  CMP and CBC were unremarkable.  Troponin negative.  CXR WNL.  No cause for CP found and was d/c'd w/ PCP f/u.    Today pt reports she feels 'normal but just pain'  Pt reports R arm BP is 'always higher' than L arm- sometimes as much as 29 points difference.  Pt reports pain will come and go.  No change w/ exertion.  The day her pain was at its highest- husband was in the hospital.  Denies SOB, N/V, dizziness, diaphoresis.     Review of Systems For ROS see HPI     Objective:   Physical Exam Vitals reviewed.  Constitutional:      General: She is not in acute distress.    Appearance: Normal appearance. She is well-developed. She is obese. She is not ill-appearing.  HENT:     Head: Normocephalic and atraumatic.   Eyes:     Conjunctiva/sclera: Conjunctivae normal.     Pupils: Pupils are equal, round, and reactive to light.   Neck:     Thyroid : No thyromegaly.   Cardiovascular:     Rate and Rhythm: Normal rate and regular rhythm.     Heart sounds: Normal heart sounds. No murmur heard. Pulmonary:     Effort: Pulmonary effort is normal. No respiratory distress.     Breath sounds: Normal breath sounds.  Abdominal:     General: There is no distension.     Palpations: Abdomen is soft.     Tenderness: There is no abdominal tenderness.   Musculoskeletal:     Cervical back: Normal range of motion and neck supple.  Lymphadenopathy:     Cervical: No cervical adenopathy.   Skin:    General: Skin is warm and dry.   Neurological:     Mental Status: She is alert and oriented to person, place, and time.   Psychiatric:        Behavior: Behavior normal.     Comments: anxious           Assessment & Plan:  CP- new.   Reviewed ER notes, labs, EKG.  Her cardiac work up was negative but she has multiple risk factors for CAD (obesity, HTN, hyperlipidemia).  Her CP was significantly worse w/ stress- husband was in the hospital- and this may be the culprit, but formal cardiac evaluation is needed.  Referral to Cardiology made.  Anxiety- new.  Pt reports increased stress recently.  Will start Alprazolam  prn and monitor both anxiety and the impact on CP.  Pt expressed understanding and is in agreement w/ plan.   Unequal BP- new.  Refer to Cards.

## 2023-09-05 NOTE — Patient Instructions (Signed)
 Follow up in 6 weeks to recheck anxiety We'll call you to schedule your Cardiology appt USE the Alprazolam as needed for anxiety or chest discomfort to see if symptoms improve Keep a log of chest pain- severity, how long it lasts, if it's associated w/ eating/anxiety/exertion/etc Call with any questions or concerns Hang in there!!

## 2023-09-06 ENCOUNTER — Ambulatory Visit: Attending: Cardiology | Admitting: Cardiology

## 2023-09-06 ENCOUNTER — Encounter: Payer: Self-pay | Admitting: Cardiology

## 2023-09-06 VITALS — BP 134/80 | HR 75 | Ht 63.0 in | Wt 267.1 lb

## 2023-09-06 DIAGNOSIS — I10 Essential (primary) hypertension: Secondary | ICD-10-CM

## 2023-09-06 DIAGNOSIS — E782 Mixed hyperlipidemia: Secondary | ICD-10-CM | POA: Diagnosis not present

## 2023-09-06 DIAGNOSIS — I209 Angina pectoris, unspecified: Secondary | ICD-10-CM | POA: Diagnosis not present

## 2023-09-06 MED ORDER — METOPROLOL TARTRATE 100 MG PO TABS: 100.0000 mg | ORAL_TABLET | Freq: Once | ORAL | 0 refills | Status: DC

## 2023-09-06 MED ORDER — NITROGLYCERIN 0.4 MG SL SUBL: 0.4000 mg | SUBLINGUAL_TABLET | SUBLINGUAL | 5 refills | Status: AC | PRN

## 2023-09-06 NOTE — Patient Instructions (Addendum)
 Medication Instructions:  Your physician has recommended you make the following change in your medication:   You have been prescribed Nitroglycerin to use as needed for chest pain: Dissolve 1 tablet under the tongue every 5 minutes as needed for chest pain. Max of 3 doses, then call 911.   *If you need a refill on your cardiac medications before your next appointment, please call your pharmacy*  Testing/Procedures: Your physician has requested that you have cardiac CT. Cardiac computed tomography (CT) is a painless test that uses an x-ray machine to take clear, detailed pictures of your heart. For further information please visit [email or web address] Please follow instruction sheet as given.  Your physician has requested that you have an echocardiogram. Echocardiography is a painless test that uses sound waves to create images of your heart. It provides your doctor with information about the size and shape of your heart and how well your heart's chambers and valves are working. This procedure takes approximately one hour. There are no restrictions for this procedure. Please do NOT wear cologne, perfume, aftershave, or lotions (deodorant is allowed). Please arrive 15 minutes prior to your appointment time.  Please note: We ask at that you not bring children with you during ultrasound (echo/ vascular) testing. Due to room size and safety concerns, children are not allowed in the ultrasound rooms during exams. Our front office staff cannot provide observation of children in our lobby area while testing is being conducted. An adult accompanying a patient to their appointment will only be allowed in the ultrasound room at the discretion of the ultrasound technician under special circumstances. We apologize for any inconvenience.  Follow-Up: At [facility], you and your health needs are our priority.  As part of our continuing mission to provide you with exceptional heart care, our providers are  all part of one team.  This team includes your primary Cardiologist (physician) and Advanced Practice Providers or APPs (Physician Assistants and Nurse Practitioners) who all work together to provide you with the care you need, when you need it.  Your next appointment:   9 month(s)  Provider:   Hillis Lu, MD  We recommend signing up for the patient portal called "MyChart".  Sign up information is provided on this After Visit Summary.  MyChart is used to connect with patients for Virtual Visits (Telemedicine).  Patients are able to view lab/test results, encounter notes, upcoming appointments, etc.  Non-urgent messages can be sent to your provider as well.   To learn more about what you can do with MyChart, go to [email or web address].   Other Instructions   Your cardiac CT will be scheduled at one of the below locations:   [facility] [address] [place], [place] [address] [phone]  OR  [facility] [address] [place], [place] [address] [phone]  OR   [facility] [address] [place], [place] [address] [phone]  OR   [facility] [address] [place], [place] [address] [phone]  OR   [facility] and Vascular Tower [address]  [place], [place] [address] Opening July 31, 2023  If scheduled at [facility], please arrive at the [facility] and Children's Entrance (Entrance C2) of [facility] 30 minutes prior to test start time. You can use the FREE valet parking offered at entrance C (encouraged to control the heart rate for the test)  Proceed to the [facility]  Radiology Department (first floor) to check-in and test prep.   All radiology patients and guests should use entrance C2 at [facility], accessed from [facility], even though the hospital's physical address listed is [address]    If scheduled at the Heart and Vascular Tower at [address], please enter the parking lot using the [address] entrance and use the FREE valet service at the patient drop-off area. Enter the buidling and check-in with registration on the main floor.  If scheduled at [facility] or [facility], please arrive 15 mins early for check-in and test prep.  There is spacious parking and easy access to the radiology department from the [facility] Heart and Vascular entrance. Please enter here and check-in with the desk attendant.   If scheduled at [facility], please arrive 30 minutes early for check-in and test prep.  Please follow these instructions carefully (unless otherwise directed):  An IV will be required for this test and Nitroglycerin will be given.  Hold all erectile dysfunction medications at least 3 days (72 hrs) prior to test. (Ie viagra, cialis, sildenafil, tadalafil, etc)   On the Night Before the Test: Be sure to Drink plenty of water. Do not consume any caffeinated/decaffeinated beverages or chocolate 12 hours prior to your test. Do not take any antihistamines (cetirizine/Zyrtec) 12 hours prior to your test.  On the Day of the Test: Drink plenty of water until 1 hour prior to the test. Do not eat any food 1 hour prior to test. You may take your regular medications prior to the test.  Take metoprolol (Lopressor) 100 mg two hours prior to test. Patients who wear a continuous glucose monitor MUST remove the device prior to scanning. FEMALES- please wear underwire-free bra if available, avoid dresses & tight clothing      After the Test: Drink plenty of water. After receiving IV contrast, you may experience a mild flushed feeling. This is normal. On occasion, you may experience a mild rash up to 24 hours after the test. This is not dangerous. If this occurs, you can take Benadryl 25 mg, Zyrtec, Claritin, or  Allegra and increase your fluid intake. (Patients taking Tikosyn should avoid Benadryl, and may take Zyrtec, Claritin, or Allegra) If you experience trouble breathing, this can be serious. If it is severe call 911 IMMEDIATELY. If it is mild, please call our office.  We will call to schedule your test 2-4 weeks out understanding that some insurance companies will need an authorization prior to the service being performed.   For more information and frequently asked questions, please visit our website : [email or web address]  For non-scheduling related questions, please contact the cardiac imaging nurse navigator should you have any questions/concerns: Cardiac Imaging Nurse Navigators Direct Office Dial: [phone]   For scheduling needs, including cancellations and rescheduling, please call [place], [phone].

## 2023-09-06 NOTE — Progress Notes (Signed)
 Cardiology Office Note:    Date:  09/06/2023   ID:  Mary Montgomery, DOB [birth year], MRN [id number]  PCP:  Jess Morita, MD  Cardiologist:  Nelia Balzarine, MD   Referring MD: Jess Morita, MD    ASSESSMENT:    1. Hypertension, unspecified type   2. Morbid obesity (HCC)   3. Mixed hyperlipidemia   4. Angina pectoris (HCC)    PLAN:    In order of problems listed above:  Primary prevention stressed with the patient.  Importance of compliance with diet medication stressed and patient verbalized standing. Angina pectoris: Patient significant symptoms are concerning.  She has multiple risk factors for coronary artery disease.  I mention various modalities of evaluation and she prefers CT coronary angiography.  Will schedule her for this.  Sublingual nitroglycerin prescription was sent, its protocol and 911 protocol explained and the patient vocalized understanding questions were answered to the patient's satisfaction Mixed dyslipidemia: On lipid-lowering medications followed by primary care. Cardiac murmur: Echocardiogram will be done to assess murmur heard on auscultation. Morbid obesity: Weight reduction stressed diet emphasized.  Risks of obesity explained and she promises to do better.  She cannot do much in terms of exercise because of orthopedic issues affecting her knee. Patient will be seen in follow-up appointment in 6 months or earlier if the patient has any concerns.    Medication Adjustments/Labs and Tests Ordered: Current medicines are reviewed at length with the patient today.  Concerns regarding medicines are outlined above.  Orders Placed This Encounter  Procedures   EKG 12-Lead   No orders of the defined types were placed in this encounter.    History of Present Illness:    Mary Montgomery is a 68 y.o. female who is being seen today for the evaluation of chest pain at the request of Tabori, Elenore Griffon, MD. patient is a pleasant 68 year old  female.  She has past medical history of mixed dyslipidemia and morbid obesity and leads a sedentary lifestyle.  She is accompanied by her husband.  She mentions to me that she has chest pain at times which may or may not occur with exertion.  No radiation to the neck or to the arms.  This is concerned her.  For this reason she was evaluated and sent here.  At the time of my evaluation, the patient is alert awake oriented and in no distress.  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  Onset April 2023.     Last Assessment & Plan:   Formatting of this note might be different from the original.  Left-sided ringing.  Relatively acute.  Nonpulsatile.  No subjective hearing loss or dizziness.  EXAMINATION reveals normal external canals and tympanic membranes bilaterally.  AUDIOGRAM: Reveals bilateral symmetric moderate h   Vitamin D  deficiency 02/13/2019    Past Surgical History:  Procedure Laterality Date   CYSTECTOMY  1988   polynidal cyst   WISDOM TOOTH EXTRACTION  1977  Current Medications: Current Meds  Medication Sig   ALPRAZolam (XANAX) 0.5 MG tablet Take 1 tablet (0.5 mg total) by mouth 2 (two) times daily as needed for anxiety.   cetirizine (ZYRTEC) 10 MG tablet Take 10 mg by mouth daily.   cholecalciferol (VITAMIN D ) 1000 UNITS tablet Take 1,000 Units by mouth daily.   cycloSPORINE (RESTASIS) 0.05 % ophthalmic emulsion Place 1 drop into both eyes 2 (two) times daily.   meloxicam (MOBIC) 15 MG tablet Take 15 mg by mouth daily as needed for pain.    rosuvastatin  (CRESTOR ) 10 MG tablet TAKE 1 TABLET(10 MG) BY MOUTH DAILY     Allergies:   Patient has no known allergies.   Social History   Socioeconomic History   Marital status: Married    Spouse name: Not on file   Number of children: Not on file   Years of education: Not on file   Highest education level: Bachelor's degree (e.g., BA, AB, BS)  Occupational History   Not on file  Tobacco Use   Smoking status: Never   Smokeless tobacco: Never  Substance and Sexual Activity   Alcohol use: No   Drug use: No   Sexual activity: Not on file  Other Topics Concern   Not on file  Social History Narrative   Not on file   Social Drivers of Health   Financial Resource Strain: Low Risk  (07/31/2023)   Overall Financial Resource Strain (CARDIA)    Difficulty of Paying Living Expenses: Not hard at all  Food Insecurity: No Food Insecurity (07/31/2023)   Hunger Vital Sign    Worried About Running Out of Food in the Last Year: Never true    Ran Out of Food in the Last Year: Never true  Transportation Needs: No Transportation Needs (07/31/2023)   PRAPARE - Administrator, Civil Service (Medical): No    Lack of Transportation (Non-Medical): No  Physical Activity: Insufficiently Active (07/31/2023)   Exercise Vital Sign    Days of Exercise per Week: 3 days    Minutes of Exercise per Session: 30 min  Stress: No Stress Concern Present (07/31/2023)   [employer] of Occupational Health - Occupational Stress Questionnaire    Feeling of Stress : Only a little  Social Connections: Moderately Isolated (07/31/2023)   Social Connection and Isolation Panel [NHANES]    Frequency of Communication with Friends and Family: More than three times a week    Frequency of Social Gatherings with Friends and Family: Twice a week    Attends Religious Services: Never    Database administrator or Organizations: No    Attends Engineer, structural: Not on file    Marital Status: Married      Family History: The patient's family history includes Anxiety disorder in her daughter; Arthritis in her father; Coarctation of the aorta in her brother; Dementia in her mother; Diabetes in her daughter and mother; Hyperlipidemia in her mother; Hypertension in her daughter and mother; Obesity in her daughter; Parkinsonism in her father.  ROS:   Please see the history of present illness.    All other systems reviewed and are negative.  EKGs/Labs/Other Studies Reviewed:    The following studies were reviewed today:  EKG Interpretation Date/Time:  Wednesday September 06 2023 15:27:48 EDT Ventricular Rate:  75 PR Interval:  170 QRS Duration:  142 QT Interval:  416 QTC Calculation: 464 R Axis:   -61  Text Interpretation: Normal sinus rhythm Right bundle  branch block Left anterior fascicular block Bifascicular block When compared with ECG of 01-Sep-2023 12:45, PREVIOUS ECG IS PRESENT Confirmed by Hillis Lu [phone]) on 09/06/2023 3:36:06 PM     Recent Labs: 08/01/2023: TSH 1.72 09/01/2023: ALT 18; BUN 13; Creatinine, Ser 0.51; Hemoglobin 14.3; Platelets 183; Potassium 4.1; Sodium 142  Recent Lipid Panel    Component Value Date/Time   CHOL 183 08/01/2023 0917   TRIG 60.0 08/01/2023 0917   HDL 80.10 08/01/2023 0917   CHOLHDL 2 08/01/2023 0917   VLDL 12.0 08/01/2023 0917   LDLCALC 91 08/01/2023 0917    Physical Exam:    VS:  BP 134/80 (BP Location: Left Arm)   Pulse 75   Ht 5\' 3"  (1.6 m)   Wt 267 lb 1.9 oz (121.2 kg)   LMP 10/01/2013   SpO2 95%   BMI 47.32 kg/m     Wt Readings from Last 3 Encounters:  09/06/23 267 lb 1.9 oz (121.2 kg)  09/05/23 264 lb 3.2 oz (119.8 kg)  09/01/23 262 lb 9.1 oz (119.1 kg)     GEN: Patient is in no acute distress HEENT: Normal NECK: No JVD; No carotid bruits LYMPHATICS: No lymphadenopathy CARDIAC: S1 S2 regular, 2/6 systolic murmur at the apex. RESPIRATORY:  Clear to auscultation without rales, wheezing or rhonchi  ABDOMEN: Soft,  non-tender, non-distended MUSCULOSKELETAL:  No edema; No deformity  SKIN: Warm and dry NEUROLOGIC:  Alert and oriented x 3 PSYCHIATRIC:  Normal affect    Signed, Nelia Balzarine, MD  09/06/2023 3:50 PM    [facility]

## 2023-09-21 ENCOUNTER — Telehealth: Payer: Self-pay | Admitting: Family Medicine

## 2023-09-21 NOTE — Telephone Encounter (Signed)
 Placed in folder at nurse station

## 2023-09-21 NOTE — Telephone Encounter (Signed)
 Type of form received: Orders for Compression garments  Additional comments:   Received by: Fax  Form should be Faxed/mailed to: (address/ fax #) [phone]  Is patient requesting call for pickup: N/A  Form placed:  Labeled & placed in provider bin  Attach charge sheet.  Provider will determine charge.  Individual made aware of 3-5 business day turn around? No

## 2023-09-21 NOTE — Telephone Encounter (Unsigned)
 Copied from CRM [id number]. Topic: Clinical - Request for Lab/Test Order >> Sep 21, 2023  1:32 PM Sophia H wrote: Reason for CRM: Cherisse Cornell [phone] ext [phone]  Checking in on orders that were faxed in for the patient - L codes.  Custom compression stockings, please verify and reach out if needed.

## 2023-09-26 NOTE — Telephone Encounter (Signed)
Form completed and returned to [facility]

## 2023-09-26 NOTE — Telephone Encounter (Signed)
 Faxed

## 2023-09-27 ENCOUNTER — Ambulatory Visit

## 2023-09-27 DIAGNOSIS — Z780 Asymptomatic menopausal state: Secondary | ICD-10-CM | POA: Diagnosis not present

## 2023-09-27 DIAGNOSIS — E2839 Other primary ovarian failure: Secondary | ICD-10-CM

## 2023-09-27 DIAGNOSIS — M85852 Other specified disorders of bone density and structure, left thigh: Secondary | ICD-10-CM | POA: Diagnosis not present

## 2023-09-27 DIAGNOSIS — M85851 Other specified disorders of bone density and structure, right thigh: Secondary | ICD-10-CM | POA: Diagnosis not present

## 2023-09-28 ENCOUNTER — Ambulatory Visit: Payer: Self-pay | Admitting: Family Medicine

## 2023-09-29 ENCOUNTER — Encounter (HOSPITAL_COMMUNITY): Payer: Self-pay

## 2023-10-02 ENCOUNTER — Encounter: Payer: Self-pay | Admitting: Family Medicine

## 2023-10-03 ENCOUNTER — Ambulatory Visit: Payer: Self-pay | Admitting: Cardiology

## 2023-10-03 ENCOUNTER — Ambulatory Visit (HOSPITAL_BASED_OUTPATIENT_CLINIC_OR_DEPARTMENT_OTHER)
Admission: RE | Admit: 2023-10-03 | Discharge: 2023-10-03 | Disposition: A | Discharge status: Home / Self Care | Source: Ambulatory Visit | Attending: Cardiology | Admitting: Cardiology

## 2023-10-03 DIAGNOSIS — I209 Angina pectoris, unspecified: Secondary | ICD-10-CM | POA: Diagnosis not present

## 2023-10-03 MED ORDER — IOHEXOL 350 MG/ML SOLN
110.0000 mL | Freq: Once | INTRAVENOUS | Status: AC | PRN
Start: 2023-10-03 — End: 2023-10-03
  Administered 2023-10-03: 110 mL via INTRAVENOUS

## 2023-10-04 DIAGNOSIS — E669 Obesity, unspecified: Secondary | ICD-10-CM | POA: Diagnosis not present

## 2023-10-04 DIAGNOSIS — Z6841 Body Mass Index (BMI) 40.0 and over, adult: Secondary | ICD-10-CM | POA: Diagnosis not present

## 2023-10-04 DIAGNOSIS — I890 Lymphedema, not elsewhere classified: Secondary | ICD-10-CM | POA: Diagnosis not present

## 2023-10-12 ENCOUNTER — Ambulatory Visit (HOSPITAL_BASED_OUTPATIENT_CLINIC_OR_DEPARTMENT_OTHER)
Admission: RE | Admit: 2023-10-12 | Discharge: 2023-10-12 | Disposition: A | Discharge status: Home / Self Care | Source: Ambulatory Visit | Attending: Cardiology | Admitting: Cardiology

## 2023-10-12 DIAGNOSIS — I209 Angina pectoris, unspecified: Secondary | ICD-10-CM | POA: Insufficient documentation

## 2023-10-12 LAB — ECHOCARDIOGRAM COMPLETE
AR max vel: 2.82 cm2
AV Area VTI: 2.8 cm2
AV Area mean vel: 2.76 cm2
AV Mean grad: 4 mmHg
AV Peak grad: 8.8 mmHg
Ao pk vel: 1.48 m/s
Area-P 1/2: 1.92 cm2
Calc EF: 63.8 %
S' Lateral: 3.1 cm
Single Plane A2C EF: 61.1 %
Single Plane A4C EF: 64.6 %

## 2023-10-18 ENCOUNTER — Ambulatory Visit: Admitting: Family Medicine

## 2023-11-04 DIAGNOSIS — Z6841 Body Mass Index (BMI) 40.0 and over, adult: Secondary | ICD-10-CM | POA: Diagnosis not present

## 2023-11-04 DIAGNOSIS — E669 Obesity, unspecified: Secondary | ICD-10-CM | POA: Diagnosis not present

## 2023-11-13 ENCOUNTER — Encounter: Payer: Self-pay | Admitting: Family Medicine

## 2023-11-13 ENCOUNTER — Encounter (HOSPITAL_BASED_OUTPATIENT_CLINIC_OR_DEPARTMENT_OTHER): Payer: Self-pay | Admitting: Family Medicine

## 2023-11-13 ENCOUNTER — Other Ambulatory Visit (HOSPITAL_BASED_OUTPATIENT_CLINIC_OR_DEPARTMENT_OTHER): Payer: Self-pay | Admitting: Family Medicine

## 2023-11-13 ENCOUNTER — Ambulatory Visit: Admitting: Family Medicine

## 2023-11-13 VITALS — BP 110/70 | HR 70 | Temp 97.6°F | Ht 63.0 in | Wt 264.2 lb

## 2023-11-13 DIAGNOSIS — Z1231 Encounter for screening mammogram for malignant neoplasm of breast: Secondary | ICD-10-CM

## 2023-11-13 DIAGNOSIS — F419 Anxiety disorder, unspecified: Secondary | ICD-10-CM | POA: Diagnosis not present

## 2023-11-13 NOTE — Assessment & Plan Note (Signed)
 Improved.  Pt reports now that she knows things are ok with her heart, she is much less anxiety than previous.  Does not feel that she needs medication at this time.  Will let me know if things change.

## 2023-11-13 NOTE — Progress Notes (Signed)
   Subjective:    Patient ID: Mary Montgomery, female    DOB: 1955-07-17, 68 y.o.   MRN: [id number]  HPI Anxiety- at last visit we started Alprazolam  prn.  Pt reports she took 1 dose and it 'knocked me out'.  Pt feels that mood is better after having heart evaluated and reassurance provided.   Review of Systems For ROS see HPI     Objective:   Physical Exam Vitals reviewed.  Constitutional:      General: She is not in acute distress.    Appearance: Normal appearance. She is not ill-appearing.  HENT:     Head: Normocephalic and atraumatic.  Eyes:     Extraocular Movements: Extraocular movements intact.     Conjunctiva/sclera: Conjunctivae normal.  Skin:    General: Skin is warm and dry.  Neurological:     General: No focal deficit present.     Mental Status: She is alert and oriented to person, place, and time.  Psychiatric:        Mood and Affect: Mood normal.        Behavior: Behavior normal.        Thought Content: Thought content normal.           Assessment & Plan:

## 2023-11-13 NOTE — Patient Instructions (Signed)
 Follow up as needed or as scheduled Keep up the good work!  You look great!! Call with any questions or concerns Stay Safe!  Stay Healthy! Enjoy the rest of your summer!!!

## 2023-11-21 ENCOUNTER — Encounter (HOSPITAL_BASED_OUTPATIENT_CLINIC_OR_DEPARTMENT_OTHER): Payer: Self-pay

## 2023-11-21 ENCOUNTER — Ambulatory Visit (HOSPITAL_BASED_OUTPATIENT_CLINIC_OR_DEPARTMENT_OTHER)
Admission: RE | Admit: 2023-11-21 | Discharge: 2023-11-21 | Disposition: A | Discharge status: Home / Self Care | Source: Ambulatory Visit | Attending: Family Medicine | Admitting: Family Medicine

## 2023-11-21 DIAGNOSIS — Z1231 Encounter for screening mammogram for malignant neoplasm of breast: Secondary | ICD-10-CM | POA: Diagnosis not present

## 2023-12-04 DIAGNOSIS — Z6841 Body Mass Index (BMI) 40.0 and over, adult: Secondary | ICD-10-CM | POA: Diagnosis not present

## 2023-12-04 DIAGNOSIS — E669 Obesity, unspecified: Secondary | ICD-10-CM | POA: Diagnosis not present

## 2023-12-20 ENCOUNTER — Encounter: Payer: Self-pay | Admitting: Family Medicine

## 2023-12-22 ENCOUNTER — Ambulatory Visit (INDEPENDENT_AMBULATORY_CARE_PROVIDER_SITE_OTHER): Admitting: Family Medicine

## 2023-12-22 ENCOUNTER — Encounter: Payer: Self-pay | Admitting: Family Medicine

## 2023-12-22 VITALS — BP 136/88 | HR 60 | Temp 98.3°F | Resp 12 | Ht 63.0 in | Wt 267.8 lb

## 2023-12-22 DIAGNOSIS — R2230 Localized swelling, mass and lump, unspecified upper limb: Secondary | ICD-10-CM | POA: Diagnosis not present

## 2023-12-22 NOTE — Patient Instructions (Signed)
 Follow up as needed or as scheduled We'll call you to schedule your hand appt [facility]! Call with any questions or concerns Stay Safe!  Stay Healthy! Hang in there!!!

## 2023-12-22 NOTE — Progress Notes (Signed)
   Subjective:    Patient ID: Mary Montgomery, female    DOB: 07/27/1955, 68 y.o.   MRN: [id number]  HPI Bump on hand- R hand.  pt reports she first noticed ~1 week ago when picking up grocery bag.  It was painful and continues to be painful if pressed on.  Heel of hand also sore at times.  No obvious injury.  Pt is R hand dominant.  No change in size or shape.  Initially was bruised but this has resolved.   Review of Systems For ROS see HPI     Objective:   Physical Exam Vitals reviewed.  Constitutional:      General: She is not in acute distress.    Appearance: Normal appearance. She is obese. She is not ill-appearing.  HENT:     Head: Normocephalic and atraumatic.  Cardiovascular:     Pulses: Normal pulses.  Musculoskeletal:     Comments: Small, very tender mass in R palm along 4th flexor tendon  Neurological:     Mental Status: She is alert.           Assessment & Plan:  Palmar mass- new.  Feels cystic but is exquisitely TTP.  Will refer to hand for complete evaluation.  Pt expressed understanding and is in agreement w/ plan.

## 2023-12-27 ENCOUNTER — Ambulatory Visit (INDEPENDENT_AMBULATORY_CARE_PROVIDER_SITE_OTHER): Admitting: Physician Assistant

## 2023-12-27 ENCOUNTER — Encounter (INDEPENDENT_AMBULATORY_CARE_PROVIDER_SITE_OTHER): Payer: Self-pay | Admitting: Physician Assistant

## 2023-12-27 ENCOUNTER — Encounter (INDEPENDENT_AMBULATORY_CARE_PROVIDER_SITE_OTHER): Payer: Self-pay

## 2023-12-27 VITALS — BP 145/90 | HR 71 | Temp 98.7°F | Ht 61.0 in | Wt 263.0 lb

## 2023-12-27 DIAGNOSIS — Z6841 Body Mass Index (BMI) 40.0 and over, adult: Secondary | ICD-10-CM

## 2023-12-27 DIAGNOSIS — I10 Essential (primary) hypertension: Secondary | ICD-10-CM

## 2023-12-27 DIAGNOSIS — M5116 Intervertebral disc disorders with radiculopathy, lumbar region: Secondary | ICD-10-CM | POA: Diagnosis not present

## 2023-12-27 DIAGNOSIS — I4510 Unspecified right bundle-branch block: Secondary | ICD-10-CM | POA: Diagnosis not present

## 2023-12-27 DIAGNOSIS — F419 Anxiety disorder, unspecified: Secondary | ICD-10-CM | POA: Diagnosis not present

## 2023-12-27 DIAGNOSIS — E559 Vitamin D deficiency, unspecified: Secondary | ICD-10-CM

## 2023-12-27 DIAGNOSIS — E669 Obesity, unspecified: Secondary | ICD-10-CM | POA: Diagnosis not present

## 2023-12-27 DIAGNOSIS — I2510 Atherosclerotic heart disease of native coronary artery without angina pectoris: Secondary | ICD-10-CM | POA: Diagnosis not present

## 2023-12-27 DIAGNOSIS — E785 Hyperlipidemia, unspecified: Secondary | ICD-10-CM

## 2023-12-27 DIAGNOSIS — M1711 Unilateral primary osteoarthritis, right knee: Secondary | ICD-10-CM

## 2023-12-27 DIAGNOSIS — M5430 Sciatica, unspecified side: Secondary | ICD-10-CM | POA: Diagnosis not present

## 2023-12-27 DIAGNOSIS — R7303 Prediabetes: Secondary | ICD-10-CM | POA: Diagnosis not present

## 2023-12-27 DIAGNOSIS — R600 Localized edema: Secondary | ICD-10-CM

## 2023-12-27 DIAGNOSIS — Z0289 Encounter for other administrative examinations: Secondary | ICD-10-CM

## 2023-12-27 DIAGNOSIS — I890 Lymphedema, not elsewhere classified: Secondary | ICD-10-CM | POA: Diagnosis not present

## 2023-12-27 DIAGNOSIS — M1990 Unspecified osteoarthritis, unspecified site: Secondary | ICD-10-CM

## 2023-12-27 DIAGNOSIS — E66813 Body mass index (BMI) 45.0-49.9, adult: Secondary | ICD-10-CM

## 2023-12-27 NOTE — Progress Notes (Signed)
 Office: [phone]  /  Fax: [phone]   Initial Visit    Mary Montgomery was seen in clinic today to evaluate for obesity. She is interested in losing weight to improve overall health and reduce the risk of weight related complications. She presents today to review program treatment options, initial physical assessment, and evaluation.     She was referred by: PCP  When asked what else they would like to accomplish? She states: Adopt a healthier eating pattern and lifestyle, Improve energy levels and physical activity, Improve existing medical conditions, Improve quality of life, Improve appearance, and Improve self-confidence  When asked how has your weight affected you? She states: Contributed to medical problems, Contributed to orthopedic problems or mobility issues, Having fatigue, and Having poor endurance  Weight history: Mary Montgomery is a 68 year old female who presents for an initial obesity management consultation.  She has struggled with obesity since childhood, with a highest recorded weight of 304 pounds. Her family history is significant for obesity, affecting both parents and all siblings except one sister. She has tried various weight loss methods, including Weight Watchers, Nutrisystem, and a ketogenic diet, without lasting success. Currently, she is focusing on low-carb and portion control strategies.  She has a history of right bundle branch block, identified in June, and has undergone several cardiac evaluations including echocardiograms and a CT scan with contrast, which required nitroglycerin  administration. She reports that her cardiac workup revealed some coronary artery blockages, but she is unsure of the significance. She has noticed a difference in blood pressure readings between her arms, with the left being lower than the right.  Her past medical history includes high blood pressure, hyperlipidemia for which she is on a statin, prediabetes with a recent  A1c of 5.8, and vitamin D  deficiency for which she takes supplements. No history of fatty liver disease, obstructive sleep apnea, diabetes, gastroesophageal reflux disease, overactive bladder, polycystic ovary syndrome, heart arrhythmias, heart failure, lung disease, kidney disease, connective tissue disorders, or blood clots.  She has arthritis, particularly in the lower back, right knee, left ankle, and degenerative discs in the lower spine. She experiences reduced physical activity due to knee pain and has a recumbent bike for exercise, though it sometimes exacerbates her knee pain.  Her social history includes being a retired Runner, broadcasting/film/video, which has reduced her physical activity compared to when she was working. She reports low stress levels and does not consume much processed food. She has not undergone metabolic surgery and has not been on medication for weight loss.  Highest weight: 304 lbs  Some associated conditions: Hypertension, Arthritis:DDD neck and Low back, right knee and left ankle, Hyperlipidemia, Prediabetes, ASCVD, Vitamin D  Deficiency, Venous insufficiency, and Other: Dx with lipedema by dermatology  Contributing factors: family history of obesity, disruption of circadian rhythm / sleep disordered breathing, reduced physical activity, slow metabolism for age, enticing relationships and enviroment, and multiple weight loss attempts in the past  Weight promoting medications identified: None  Prior weight loss attempts: Weight Watchers, Nutrisystem, Low Carb, Ketogenic, Tracking and Journaling, Meal Replacements, and Balanced Plate / Portion Control  Current nutrition plan: Low-carb and Portion control / smart choices  Current level of physical activity: None  Current or previous pharmacotherapy: Is interested in pharmacotherapy  Response to medication: Never tried medications   Past medical history includes:   Past Medical History:  Diagnosis Date   Allergy    Hayfever    Arthritis    right knee  Cataract June 2023   Small   Hay fever    Hyperlipemia    Hyperlipidemia 01/09/2014   Hypertension    Morbid obesity (HCC) 10/07/2013   Osteoarthritis of right knee 03/09/2020   Pain in joint of right knee 01/27/2020   Physical exam 10/07/2013   Pinched nerve 03/19/2020   Plantar fasciitis    bilaterl feet   Prediabetes 10/21/2020   Tinnitus of left ear 10/21/2021   Formatting of this note might be different from the original.  Onset April 2023.     Last Assessment & Plan:   Formatting of this note might be different from the original.  Left-sided ringing.  Relatively acute.  Nonpulsatile.  No subjective hearing loss or dizziness.  EXAMINATION reveals normal external canals and tympanic membranes bilaterally.  AUDIOGRAM: Reveals bilateral symmetric moderate h   Vitamin D  deficiency 02/13/2019     Objective    BP (!) 145/90   Pulse 71   Temp 98.7 F (37.1 C)   Ht 5' 3 (1.6 m)   Wt 263 lb (119.3 kg)   LMP 10/01/2013   SpO2 98%   BMI 46.59 kg/m  She was weighed on the bioimpedance scale: Body mass index is 46.59 kg/m.  Body Fat%:58%, Visceral Fat Rating:23, Weight trend over the last 12 months: Unchanged  General:  Alert, oriented and cooperative. Patient is in no acute distress.  Respiratory: Normal respiratory effort, no problems with respiration noted   Gait: able to ambulate independently  Mental Status: Normal mood and affect. Normal behavior. Normal judgment and thought content.   DIAGNOSTIC DATA REVIEWED:  BMET    Component Value Date/Time   NA 142 09/01/2023 1246   K 4.1 09/01/2023 1246   CL 104 09/01/2023 1246   CO2 28 09/01/2023 1246   GLUCOSE 85 09/01/2023 1246   BUN 13 09/01/2023 1246   CREATININE 0.51 09/01/2023 1246   CALCIUM  9.3 09/01/2023 1246   GFRNONAA >60 09/01/2023 1246   GFRAA >60 01/21/2018 0910   Lab Results  Component Value Date   HGBA1C 5.8 08/01/2023   HGBA1C 5.6 10/07/2013   No results found for:  INSULIN CBC    Component Value Date/Time   WBC 6.2 09/01/2023 1246   RBC 4.70 09/01/2023 1246   HGB 14.3 09/01/2023 1246   HCT 42.9 09/01/2023 1246   PLT 183 09/01/2023 1246   MCV 91.3 09/01/2023 1246   MCH 30.4 09/01/2023 1246   MCHC 33.3 09/01/2023 1246   RDW 13.5 09/01/2023 1246   Iron/TIBC/Ferritin/ %Sat No results found for: IRON, TIBC, FERRITIN, IRONPCTSAT Lipid Panel     Component Value Date/Time   CHOL 183 08/01/2023 0917   TRIG 60.0 08/01/2023 0917   HDL 80.10 08/01/2023 0917   CHOLHDL 2 08/01/2023 0917   VLDL 12.0 08/01/2023 0917   LDLCALC 91 08/01/2023 0917   Hepatic Function Panel     Component Value Date/Time   PROT 6.8 09/01/2023 1246   ALBUMIN 4.1 09/01/2023 1246   AST 22 09/01/2023 1246   ALT 18 09/01/2023 1246   ALKPHOS 63 09/01/2023 1246   BILITOT 0.5 09/01/2023 1246   BILIDIR 0.1 08/01/2023 0917      Component Value Date/Time   TSH 1.72 08/01/2023 0917     Assessment and Plan   Prediabetes  Primary hypertension  Anxiety  Vitamin D  deficiency  Lymphedema  Hyperlipidemia, unspecified hyperlipidemia type  Arthritis  Primary osteoarthritis of right knee  Class 3 severe obesity due to excess calories with serious  comorbidity and body mass index (BMI) of 45.0 to 49.9 in adult  Lipedema of lower extremity  Current BMI 49.8  Assessment and Plan Assessment & Plan  Obesity Chronic obesity with a family history of obesity. High body fat percentage (58%) and visceral adipose rating (23) indicate increased risk for cardiovascular disease, type 2 diabetes, and other chronic conditions. - Initiate weight management program with focus on low carb and portion control diet - Schedule metabolism test to tailor nutrition plan - Consider home sleep study to evaluate for obstructive sleep apnea - Encourage regular follow-up every 2-3 weeks for the first 3 months  Prediabetes A1c of 5.8% indicating prediabetes. Weight loss and  lifestyle modifications are crucial to prevent progression to type 2 diabetes. - Monitor A1c levels regularly - Incorporate weight loss strategies to reduce risk of progression to type 2 diabetes  Hypertension Newly diagnosed hypertension with blood pressure differences between arms, left arm lower than right. - Incorporate weight loss strategies to aid in blood pressure management  Hyperlipidemia On statin therapy for hyperlipidemia. Weight loss and lifestyle changes may improve lipid profile. - Continue statin therapy - Incorporate weight loss strategies to improve lipid profile  Coronary artery disease, minimal nonobstructive Minimal nonobstructive coronary artery disease with less than 24% obstruction. 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 Pain limits physical activity, impacting weight management efforts. - Encourage low-impact exercises such as chair exercises and yoga - Consider pool therapy once pool is available  Lipedema with lymphedema of lower extremities Lipedema characterized by fatty pockets in the lower extremities. Undergoing therapy and using compression garments. - Continue use of compression garments - Continue therapy for lipedema  Right bundle branch block Diagnosed with right bundle branch block. No current symptoms or arrhythmias reported.       Obesity Treatment / Action Plan:  Patient will work on garnering support from family and friends to begin weight loss journey. Will work on eliminating or reducing the presence of highly palatable, calorie dense foods in the home. Will complete provided nutritional and psychosocial assessment questionnaire before the next appointment. Will be scheduled for indirect calorimetry to determine resting energy expenditure  in a fasting state.  This will allow us  to create a reduced calorie, high-protein meal plan to promote loss of fat mass while preserving muscle mass. Will think about ideas on how to incorporate physical activity into their daily routine. Will work on reducing intake of added sugars, simple sugars and processed carbs. Will work on reading labels, making healthier choices and watching portion sizes. Counseled on the health benefits of losing 5%-15% of total body weight. Will work on improving sleep hygiene and trying to obtain at least 7 hours of sleep. Was counseled on nutritional approaches to weight loss and benefits of reducing processed foods and consuming plant-based foods and high quality protein as part of nutritional weight management. Was counseled on pharmacotherapy and role as an adjunct in weight management.  Will work on increasing water intake with a goal of 125 ounces for men and 91 ounces for women.   Obesity Education Performed Today:  She was weighed on the bioimpedance scale and results were discussed and documented in the synopsis.  We discussed obesity as a disease and the importance of a more detailed evaluation of all the factors contributing to the disease.  We discussed the importance of long term lifestyle changes which include nutrition, exercise and behavioral modifications as well as the importance of customizing this to her specific health and social needs.  We discussed the benefits of reaching a healthier weight to alleviate the symptoms of existing conditions and reduce the risks of the biomechanical, metabolic and psychological effects of obesity.  We reviewed the four pillars of obesity medicine and importance of using a multimodal approach.  We reviewed the basic principles in weight management.   Mary Montgomery appears to be in the action stage of change and states they are ready to start intensive lifestyle modifications and behavioral  modifications.  I have spent 39 minutes in the care of the patient today including: 3 minutes before the visit reviewing and preparing the chart. 30 minutes face-to-face assessing and reviewing listed medical problems as outlined in obesity care plan, providing nutritional and behavioral counseling on topics outlined in the obesity care plan, independently interpreting test results and goals of care, as described in assessment and plan, reviewing and discussing biometric information and progress, and reviewing latest PCP notes and specialist consultations 6 minutes after the visit updating chart and documentation of encounter.  Reviewed by clinician on day of visit: allergies, medications, problem list, medical history, surgical history, family history, social history, and previous encounter notes pertinent to obesity diagnosis.   Vitoria Conyer,PA-C

## 2023-12-28 DIAGNOSIS — M79641 Pain in right hand: Secondary | ICD-10-CM | POA: Diagnosis not present

## 2023-12-28 DIAGNOSIS — R2230 Localized swelling, mass and lump, unspecified upper limb: Secondary | ICD-10-CM | POA: Insufficient documentation

## 2023-12-28 DIAGNOSIS — R2231 Localized swelling, mass and lump, right upper limb: Secondary | ICD-10-CM | POA: Diagnosis not present

## 2024-01-03 ENCOUNTER — Ambulatory Visit (INDEPENDENT_AMBULATORY_CARE_PROVIDER_SITE_OTHER)

## 2024-01-03 VITALS — Ht 61.0 in | Wt 263.0 lb

## 2024-01-03 DIAGNOSIS — Z0000 Encounter for general adult medical examination without abnormal findings: Secondary | ICD-10-CM

## 2024-01-03 NOTE — Patient Instructions (Signed)
 Mary Montgomery,  Thank you for taking the time for your Medicare Wellness Visit. I appreciate your continued commitment to your health goals. Please review the care plan we discussed, and feel free to reach out if I can assist you further.  Medicare recommends these wellness visits once per year to help you and your care team stay ahead of potential health issues. These visits are designed to focus on prevention, allowing your provider to concentrate on managing your acute and chronic conditions during your regular appointments.  Please note that Annual Wellness Visits do not include a physical exam. Some assessments may be limited, especially if the visit was conducted virtually. If needed, we may recommend a separate in-person follow-up with your provider.  Ongoing Care Seeing your primary care provider every 3 to 6 months helps us  monitor your health and provide consistent, personalized care. Last office visit on 12/22/2023.  You are due a Flu vaccine and can get that done here in office or at your pharmacy.    Referrals If a referral was made during today's visit and you haven't received any updates within two weeks, please contact the referred provider directly to check on the status.  Recommended Screenings:  Health Maintenance  Topic Date Due   Flu Shot  11/03/2023   COVID-19 Vaccine (8 - 2025-26 season) 12/04/2023   Medicare Annual Wellness Visit  07/31/2024   Cologuard (Stool DNA test)  09/30/2024   Breast Cancer Screening  11/20/2025   DTaP/Tdap/Td vaccine (3 - Td or Tdap) 09/17/2031   Pneumococcal Vaccine for age over 26  Completed   DEXA scan (bone density measurement)  Completed   Hepatitis C Screening  Completed   Zoster (Shingles) Vaccine  Completed   HPV Vaccine  Aged Out   Meningitis B Vaccine  Aged Out   Colon Cancer Screening  Discontinued       01/03/2024    9:42 AM  Advanced Directives  Does Patient Have a Medical Advance Directive? Yes  Type of Sports coach of [place];Living will  Copy of Healthcare Power of Attorney in Chart? No - copy requested   Advance Care Planning is important because it: Ensures you receive medical care that aligns with your values, goals, and preferences. Provides guidance to your family and loved ones, reducing the emotional burden of decision-making during critical moments.  Vision: Annual vision screenings are recommended for early detection of glaucoma, cataracts, and diabetic retinopathy. These exams can also reveal signs of chronic conditions such as diabetes and high blood pressure.  Dental: Annual dental screenings help detect early signs of oral cancer, gum disease, and other conditions linked to overall health, including heart disease and diabetes.  Please see the attached documents for additional preventive care recommendations.

## 2024-01-03 NOTE — Progress Notes (Signed)
 Subjective:   Mary Montgomery is a 68 y.o. who presents for a Medicare Wellness preventive visit.  As a reminder, Annual Wellness Visits don't include a physical exam, and some assessments may be limited, especially if this visit is performed virtually. We may recommend an in-person follow-up visit with your provider if needed.  Visit Complete: Virtual I connected with  Reena LITTIE Rhyme on 01/03/24 by a audio enabled telemedicine application and verified that I am speaking with the correct person using two identifiers.  Patient Location: Home  Provider Location: Home Office  I discussed the limitations of evaluation and management by telemedicine. The patient expressed understanding and agreed to proceed.  Vital Signs: Because this visit was a virtual/telehealth visit, some criteria may be missing or patient reported. Any vitals not documented were not able to be obtained and vitals that have been documented are patient reported.  VideoDeclined- This patient declined Librarian, academic. Therefore the visit was completed with audio only.  Persons Participating in Visit: Patient.  AWV Questionnaire: Yes: Patient Medicare AWV questionnaire was completed by the patient on 01/03/2024; I have confirmed that all information answered by patient is correct and no changes since this date.  Cardiac Risk Factors include: advanced age (>21men, >98 women);hypertension;dyslipidemia;obesity (BMI >30kg/m2)     Objective:    Today's Vitals   01/03/24 0933  Weight: 263 lb (119.3 kg)  Height: 5' 1 (1.549 m)   Body mass index is 49.69 kg/m.     01/03/2024    9:42 AM 06/10/2019    3:51 PM 01/21/2018    9:08 AM  Advanced Directives  Does Patient Have a Medical Advance Directive? Yes No No   Type of Estate agent of [place];Living will    Copy of Healthcare Power of Attorney in Chart? No - copy requested    Would patient like information on  creating a medical advance directive?  Yes (ED - Information included in AVS)      Data saved with a previous flowsheet row definition    Current Medications (verified) Outpatient Encounter Medications as of 01/03/2024  Medication Sig   calcium  carbonate (OSCAL) 1500 (600 Ca) MG TABS tablet Take 600 mg of elemental calcium  by mouth 2 (two) times daily with a meal.   cetirizine (ZYRTEC) 10 MG tablet Take 10 mg by mouth daily.   cholecalciferol (VITAMIN D ) 1000 UNITS tablet Take 1,000 Units by mouth daily.   cycloSPORINE (RESTASIS) 0.05 % ophthalmic emulsion Place 1 drop into both eyes 2 (two) times daily.   ibuprofen (ADVIL) 100 MG tablet Take 100 mg by mouth every 6 (six) hours as needed for fever.   meloxicam (MOBIC) 15 MG tablet Take 15 mg by mouth daily as needed for pain.   nitroGLYCERIN  (NITROSTAT ) 0.4 MG SL tablet Place 1 tablet (0.4 mg total) under the tongue every 5 (five) minutes as needed for chest pain. Max of 3 doses, then call 911.   rosuvastatin  (CRESTOR ) 10 MG tablet TAKE 1 TABLET(10 MG) BY MOUTH DAILY   No facility-administered encounter medications on file as of 01/03/2024.    Allergies (verified) Patient has no known allergies.   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  Onset April 2023.     Last Assessment & Plan:   Formatting of this note might be different from the original.  Left-sided ringing.  Relatively acute.  Nonpulsatile.  No subjective hearing loss or dizziness.   EXAMINATION reveals normal external canals and tympanic membranes bilaterally.  AUDIOGRAM: Reveals bilateral symmetric moderate h   Vitamin D  deficiency 02/13/2019   Past Surgical History:  Procedure Laterality Date   CYSTECTOMY  1988   polynidal cyst   WISDOM TOOTH EXTRACTION  1977   Family History  Problem Relation Age of Onset   Diabetes Mother    Hypertension Mother    Hyperlipidemia Mother    Dementia Mother    Obesity Mother    Parkinsonism Father    Arthritis Father        and father side of family   Clotting disorder Father    COPD Father    Coarctation of the aorta Brother    Anxiety disorder Daughter    Diabetes Daughter    Hypertension Daughter    Obesity Daughter    Arthritis Paternal Grandmother    Anxiety disorder Daughter    Diabetes Daughter    Hearing loss Brother    Obesity Daughter    Obesity Daughter    Social History   Socioeconomic History   Marital status: Married    Spouse name: Taft   Number of children: 2   Years of education: Not on file   Highest education level: Bachelor's degree (e.g., BA, AB, BS)  Occupational History   Occupation: RETIRED  Tobacco Use   Smoking status: Never   Smokeless tobacco: Never  Vaping Use   Vaping status: Never Used  Substance and Sexual Activity   Alcohol use: No   Drug use: No   Sexual activity: Not on file  Other Topics Concern   Not on file  Social History Narrative   Lives with husband and has 1 dog/2025   Social Drivers of Health   Financial Resource Strain: Low Risk  (12/21/2023)   Overall Financial Resource Strain (CARDIA)    Difficulty of Paying Living Expenses: Not hard at all  Food Insecurity: No Food Insecurity (12/21/2023)   Hunger Vital Sign    Worried About Running Out of Food in the Last Year: Never true    Ran Out of Food in the Last Year: Never true  Transportation Needs: No Transportation Needs (01/03/2024)   PRAPARE - Administrator, Civil Service (Medical): No     Lack of Transportation (Non-Medical): No  Physical Activity: Insufficiently Active (01/03/2024)   Exercise Vital Sign    Days of Exercise per Week: 2 days    Minutes of Exercise per Session: 20 min  Stress: No Stress Concern Present (01/03/2024)   [employer] of Occupational Health - Occupational Stress Questionnaire    Feeling of Stress: Not at all  Social Connections: Moderately Isolated (01/03/2024)   Social Connection and Isolation Panel    Frequency of Communication with Friends and Family: More than three times a week    Frequency of Social Gatherings with Friends and Family: Once a week    Attends Religious Services:  Never    Active Member of Clubs or Organizations: No    Attends Banker Meetings: Never    Marital Status: Married    Tobacco Counseling Counseling given: Not Answered    Clinical Intake:  Pre-visit preparation completed: Yes  Pain : No/denies pain     BMI - recorded: 49.69 Nutritional Status: BMI > 30  Obese Nutritional Risks: None Diabetes: No  Lab Results  Component Value Date   HGBA1C 5.8 08/01/2023   HGBA1C 5.7 11/25/2022   HGBA1C 5.7 10/21/2020     How often do you need to have someone help you when you read instructions, pamphlets, or other written materials from your doctor or pharmacy?: 1 - Never  Interpreter Needed?: No  Information entered by :: Amairani Shuey, RMA   Activities of Daily Living     01/03/2024    8:45 AM 07/31/2023    4:50 PM  In your present state of health, do you have any difficulty performing the following activities:  Hearing? 0 0  Vision? 0 0  Difficulty concentrating or making decisions? 0 0  Walking or climbing stairs? 1 1  Dressing or bathing? 0 0  Doing errands, shopping? 0 0  Preparing Food and eating ? N N  Using the Toilet? N N  In the past six months, have you accidently leaked urine? N N  Do you have problems with loss of bowel control? N N  Managing your Medications? N N   Managing your Finances? N N  Housekeeping or managing your Housekeeping? N N    Patient Care Team: Tabori, Katherine E, MD as PCP - General (Family Medicine) Ortho, [facility] (Specialist) Kari Alm PARAS, OD (Optometry)  I have updated your Care Teams any recent Medical Services you may have received from other providers in the past year.     Assessment:   This is a routine wellness examination for Marliyah.  Hearing/Vision screen Hearing Screening - Comments:: Denies hearing difficulties   Vision Screening - Comments:: Wears eyeglasses/[facility]   Goals Addressed               This Visit's Progress     Weight (lb) < 200 lb (90.7 kg) (pt-stated)   263 lb (119.3 kg)     Lose weight /40 lb for knee replacement/2025       Depression Screen     01/03/2024    9:45 AM 11/13/2023    9:51 AM 01/19/2023    2:19 PM 11/25/2022   10:15 AM 12/16/2021    1:22 PM 11/11/2021   12:46 PM 09/16/2021    8:02 AM  PHQ 2/9 Scores  PHQ - 2 Score 0 0 0 0 0 0 0  PHQ- 9 Score 0  3 2  0 0    Fall Risk     01/03/2024    8:45 AM 07/31/2023    4:50 PM 01/19/2023    2:19 PM 11/25/2022   10:15 AM 12/16/2021    1:22 PM  Fall Risk   Falls in the past year? 0 0 0 0 0  Number falls in past yr:   0 0   Injury with Fall?   0 0 0  Risk for fall due to :   No Fall Risks No Fall Risks No Fall Risks  Follow up Falls evaluation completed;Falls prevention discussed  Falls evaluation completed Falls evaluation completed Falls evaluation completed      Data saved with a previous flowsheet row definition  MEDICARE RISK AT HOME:  Medicare Risk at Home Any stairs in or around the home?: (Patient-Rptd) Yes If so, are there any without handrails?: (Patient-Rptd) No Home free of loose throw rugs in walkways, pet beds, electrical cords, etc?: (Patient-Rptd) No Adequate lighting in your home to reduce risk of falls?: (Patient-Rptd) Yes Life alert?: (Patient-Rptd) No Use of a cane, walker or w/c?:  (Patient-Rptd) Yes Grab bars in the bathroom?: (Patient-Rptd) Yes Shower chair or bench in shower?: (Patient-Rptd) No Elevated toilet seat or a handicapped toilet?: (Patient-Rptd) No  TIMED UP AND GO:  Was the test performed?  No  Cognitive Function: Declined/Normal: No cognitive concerns noted by patient or family. Patient alert, oriented, able to answer questions appropriately and recall recent events. No signs of memory loss or confusion.        Immunizations Immunization History  Administered Date(s) Administered   Influenza Inj Mdck Quad Pf 12/08/2018   Influenza, Seasonal, Injecte, Preservative Fre 01/26/2015, 02/10/2016   Influenza,inj,Quad PF,6+ Mos 01/09/2014   Influenza-Unspecified 12/17/2020, 12/07/2021, 01/02/2023   PFIZER(Purple Top)SARS-COV-2 Vaccination 05/26/2019, 06/22/2019, 12/29/2019, 07/28/2020, 12/17/2020   PNEUMOCOCCAL CONJUGATE-20 11/25/2022   Pfizer Covid-19 Vaccine Bivalent Booster 95yrs & up 12/17/2020, 01/02/2023   Tdap 10/24/2011, 09/16/2021   Zoster Recombinant(Shingrix) 09/11/2020, 01/15/2021    Screening Tests Health Maintenance  Topic Date Due   Influenza Vaccine  11/03/2023   COVID-19 Vaccine (8 - 2025-26 season) 12/04/2023   Medicare Annual Wellness (AWV)  07/31/2024   Fecal DNA (Cologuard)  09/30/2024   Mammogram  11/20/2025   DTaP/Tdap/Td (3 - Td or Tdap) 09/17/2031   Pneumococcal Vaccine: 50+ Years  Completed   DEXA SCAN  Completed   Hepatitis C Screening  Completed   Zoster Vaccines- Shingrix  Completed   HPV VACCINES  Aged Out   Meningococcal B Vaccine  Aged Out   Colonoscopy  Discontinued    Health Maintenance Items Addressed: See Nurse Notes at the end of this note  Additional Screening:  Vision Screening: Recommended annual ophthalmology exams for early detection of glaucoma and other disorders of the eye. Is the patient up to date with their annual eye exam?  No  Who is the provider or what is the name of the office in  which the patient attends annual eye exams? Dr.  [facility] in [facility]  Dental Screening: Recommended annual dental exams for proper oral hygiene  Community Resource Referral / Chronic Care Management: CRR required this visit?  No   CCM required this visit?  No   Plan:    I have personally reviewed and noted the following in the patient's chart:   Medical and social history Use of alcohol, tobacco or illicit drugs  Current medications and supplements including opioid prescriptions. Patient is not currently taking opioid prescriptions. Functional ability and status Nutritional status Physical activity Advanced directives List of other physicians Hospitalizations, surgeries, and ER visits in previous 12 months Vitals Screenings to include cognitive, depression, and falls Referrals and appointments  In addition, I have reviewed and discussed with patient certain preventive protocols, quality metrics, and best practice recommendations. A written personalized care plan for preventive services as well as general preventive health recommendations were provided to patient.   Justan Gaede L Sha Burling, CMA   01/03/2024   After Visit Summary: (MyChart) Due to this being a telephonic visit, the after visit summary with patients personalized plan was offered to patient via MyChart   Notes: Patient is due for a Flu vaccine and would like to get  that during her next office visit.  She had no other concerns to address today.

## 2024-01-25 ENCOUNTER — Ambulatory Visit (INDEPENDENT_AMBULATORY_CARE_PROVIDER_SITE_OTHER): Admitting: Family Medicine

## 2024-01-25 ENCOUNTER — Encounter (INDEPENDENT_AMBULATORY_CARE_PROVIDER_SITE_OTHER): Payer: Self-pay | Admitting: Family Medicine

## 2024-01-25 VITALS — BP 148/90 | HR 77 | Temp 98.1°F | Ht 62.0 in | Wt 259.0 lb

## 2024-01-25 DIAGNOSIS — E669 Obesity, unspecified: Secondary | ICD-10-CM | POA: Diagnosis not present

## 2024-01-25 DIAGNOSIS — I4510 Unspecified right bundle-branch block: Secondary | ICD-10-CM | POA: Diagnosis not present

## 2024-01-25 DIAGNOSIS — Z6841 Body Mass Index (BMI) 40.0 and over, adult: Secondary | ICD-10-CM | POA: Diagnosis not present

## 2024-01-25 DIAGNOSIS — E66813 Obesity, class 3: Secondary | ICD-10-CM

## 2024-01-25 DIAGNOSIS — R030 Elevated blood-pressure reading, without diagnosis of hypertension: Secondary | ICD-10-CM

## 2024-01-25 DIAGNOSIS — Z1331 Encounter for screening for depression: Secondary | ICD-10-CM | POA: Diagnosis not present

## 2024-01-25 DIAGNOSIS — R5383 Other fatigue: Secondary | ICD-10-CM

## 2024-01-25 DIAGNOSIS — E785 Hyperlipidemia, unspecified: Secondary | ICD-10-CM

## 2024-01-25 DIAGNOSIS — R7303 Prediabetes: Secondary | ICD-10-CM | POA: Diagnosis not present

## 2024-01-25 DIAGNOSIS — R0602 Shortness of breath: Secondary | ICD-10-CM | POA: Diagnosis not present

## 2024-01-25 NOTE — Progress Notes (Signed)
 Mary Montgomery, D.O.  ABFM, ABOM Specializing in Clinical Bariatric Medicine Office located at: [address]  [place], [place]  [address]   Bariatric Medicine Visit  Dear Mary Montgomery BRAVO, MD   Thank you for referring Mary Montgomery to our clinic today for evaluation.  We performed a consultation to discuss her options for treatment and educate the patient on her disease state.  The following note includes my evaluation and treatment recommendations.   Please do not hesitate to reach out to me directly if you have any further concerns.    Assessment and Plan:   Orders Placed This Encounter  Procedures   Comprehensive metabolic panel with GFR   CBC with Differential/Platelet   Vitamin B12   VITAMIN D  25 Hydroxy (Vit-D Deficiency, Fractures)   T4, free   TSH   Lipid panel   Hemoglobin A1c   Insulin, random   Folate     FOR THE DISEASE OF OBESITY:  obesity (BMI) of 45.0 to 49.9 in adult Mary Montgomery) start 47.36 Obesity (HCC) current 47.36 Assessment & Plan: Mary Montgomery is currently in the action stage of change. As such, her goal is to start our weight management plan.  She has agreed to implement: Cat 1 MP   Behavioral Intervention We discussed the following Behavioral Modification Strategies today: avoiding skipping meals, increasing water intake , keeping healthy foods at home, and identifying sources and decreasing liquid calories  Additional resources provided today: Handout on CAT 1 meal plan  and Handout on CAT 1-2 lunch options, Sample grocery list  Evidence-based interventions for health behavior change were utilized today including the discussion of self monitoring techniques, problem-solving barriers and SMART goal setting techniques.    Goal(s) for next OV: measure lean proteins and weigh veggies    Recommended Physical Activity Goals Mary Montgomery has been advised to work up to 300-450  minutes of moderate intensity aerobic activity a week and strengthening  exercises 2-3 times per week for cardiovascular health, weight loss maintenance and preservation of muscle mass.   She has agreed to : maintain current level of activity.    Pharmacotherapy We discussed various medication options to help Mary Montgomery with her weight loss efforts and we both agreed to: Begin with nutritional and behavioral strategies   ASSOCIATED CONDITIONS ADDRESSED TODAY:  Fatigue Assessment & Plan: Mary Montgomery does feel that her weight is causing her energy to be lower than it should be. Fatigue may be related to obesity, depression or many other causes. she does not appear to have any red flag symptoms and this appears to most likely be related to her current lifestyle habits and dietary intake.  Labs will be ordered and reviewed with her at their next office visit in two weeks.  Epworth sleepiness scale is 5 and appears to be within normal limits. Mary Montgomery denies daytime somnolence and denies waking up still tired. Patient has a history of symptoms of morning headache. Mary Montgomery generally gets 7-9 hours of sleep per night, and states that she has generally restful sleep. Snoring is present. Apneic episodes are not present.   ECG: Reviewed/ interpreted independently. ECG done on 10/12/23 through Cardiology Dr. Gilmore was seen on  09/06/23 OV notes were read and reviewed. Order echocardigram and coronary clacium score results below  Left ventricular ejection fraction, by estimation, is 60 to 65% . Left ventricular ejection fraction by 3D volume is 59 % . The left ventricle has normal function. The left ventricle has no regional wall motion abnormalities.  Left ventricular diastolic parameters are consistent with Grade I diastolic dysfunction ( impaired relaxation) . The average left ventricular global longitudinal strain is - 23. 4 % . The global longitudinal strain is normal. 2. Right ventricular systolic function is normal. The right ventricular size is normal. 3. The mitral valve is normal  in structure. No evidence of mitral valve regurgitation. No evidence of mitral stenosis. 4. The aortic valve is normal in structure. Aortic valve regurgitation is not visualized. No aortic stenosis is present. 5. The inferior vena cava is normal in size with greater than 50% respiratory variability, suggesting right atrial pressure of 3 mmHg. 2. IMPRESSION:  Coronary calcium  score of 0.  Normal coronary origin with right dominance.  CAD-RADS 1. Minimal non-obstructive CAD (0-24%). Consider non-atherosclerotic causes of chest pain. Consider preventive therapy and risk factor modification.   Plaque analysis: TPV 77 mm3, 32nd percentile, calcified plaque 0 mm3, non calcified plaque 77 mm3. Mild TPV.  Shortness of breath on exertion Assessment & Plan: Mary Montgomery does feel that she gets out of breath more easily than she used to when she exercises and seems to be worsening over time with weight gain.  This has gotten worse recently. Mary Montgomery denies shortness of breath at rest or orthopnea. Pt denies chest pain, dizziness, heart palpitations, or excessive diaphoresis or nausea with activity.  This is not new and is ongoing.  Mary Montgomery's shortness of breath appears to be obesity related and exercise induced, as they do not appear to have any red flag symptoms/ concerns today.  Also, this condition appears to be related to a state of poor cardiovascular conditioning   Obtain labs today and will be reviewed with her at their next office visit in two weeks.  Indirect Calorimeter completed today to help guide our dietary regimen. It shows a VO2 of 227 and a REE of 1570.  Her calculated basal metabolic rate is 8327 thus her resting energy expenditure is worse than expected.  Patient agreed to work on weight loss at this time.  As Mary Montgomery progresses through our weight loss program, we will gradually increase exercise as tolerated to treat her current condition.   If Mary Montgomery follows our recommendations and loses 5-10% of  their weight without improvement of her shortness of breath or if at any time, symptoms become more concerning, they agree to urgently follow up with their PCP/ specialist for further consideration/ evaluation.   Mary Montgomery verbalizes agreement with this plan.     Depression Screen  Assessment & Plan: Her PHQ-9 score was 0 today.   Prediabetes Assessment & Plan Lab Results  Component Value Date   HGBA1C 5.8 08/01/2023   HGBA1C 5.7 11/25/2022   HGBA1C 5.7 10/21/2020    Pt states that she was diagnosed about 3 years ago. Pt denies every being on any medications before. Diet and life style controlled. Explained role of simple carbs and insulin levels on hunger and cravings. Begin following prudent meal plan and decreasing simple carbs and sugar and increasing lean proteins. Will obtain labs today and review at next OV.     Elevated blood pressure reading Assessment & Plan BP Readings from Last 3 Encounters:  01/25/24 (!) 158/91  12/27/23 (!) 145/90  12/22/23 136/88   The 10-year ASCVD risk score (Arnett DK, et al., 2019) is: 10.4%  Lab Results  Component Value Date   CREATININE 0.51 09/01/2023   Pt states that she has never been prescribed any medications. BP today is not at goal. Pt states that  her BP being increased is not unusual. She reports when checking her BP at home it is usually around 132/82. She mentions that her BP on her arms differ up to 15 points. She has mentioned this to her PCP and cardiologist. Explained to pt how having poor control of her BP can lead to kidney issues and excess strain on the heart. Begin following meal plan and avoid buying foods that are: processed, frozen, or prepackaged to avoid excess salt. Will monitor alongside PCP/Specialist.      Hyperlipidemia, unspecified hyperlipidemia type Assessment & Plan Lab Results  Component Value Date   CHOL 183 08/01/2023   HDL 80.10 08/01/2023   LDLCALC 91 08/01/2023   TRIG 60.0 08/01/2023   CHOLHDL 2  08/01/2023    On Crestor  10 mg. Reports good compliance and tolerance. Pts previous lipid panel show an LDL of 91 which is at goal, goal levels is below 100. HDL was 80. Will recheck labs today. Stressed the importance that patient continue with our prudent nutritional plan that is low in saturated and trans fats, and low in fatty carbs to improve these numbers.      Right bundle branch block (RBBB) Assessment & Plan Identified in June, and has undergone several cardiac evaluations including echocardiograms and a CT scan with contrast, which required nitroglycerin  administration. She reports that her cardiac workup revealed some coronary artery blockages. Coronary calcium  score done on 10/03/23 was a 0. Will continue monitoring alongside PCP/Specialist.       FOLLOW UP:   Follow up in 2 weeks. She was informed of the importance of frequent follow up visits to maximize her success with intensive lifestyle modifications for her multiple health conditions.  MARSI TURVEY is aware that we will review all of her lab results at our next visit.  She is aware that if anything is critical/ life threatening with the results, we will be contacting her via MyChart prior to the office visit to discuss management.     Chief Complaint:   OBESITY Mary Montgomery (MR# [id number]) is a pleasant 68 y.o. female who presents for evaluation and treatment of obesity and related comorbidities. Current BMI is Body mass index is 47.37 kg/m. Mary Montgomery has been struggling with her weight for many years and has been unsuccessful in either losing weight, maintaining weight loss, or reaching her healthy weight goal.  CHIMERE KLINGENSMITH is currently in the action stage of change and ready to dedicate time achieving and maintaining a healthier weight. Mary Montgomery is interested in becoming our patient and working on intensive lifestyle modifications including (but not limited to) diet and exercise for  weight loss.  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 Patient is married to Mary Montgomery and has children. She lives with her husband.  - Pt has 2 children  - No formal exercise but stretched for 10-20 min 2 times a week.  - Wants to be 215 lbs ASAP   - She need to lose weight for knee surgery, her BMI needs to be under 35   - At 68 years old she weighed 180 lbs  - Heaviest weight was 304 lbs  - Has tried Keto, Whole 30, weight watchers and low carb diets in the past.  - Liked keto/low carb and whole 30 the best.   - Eats take out once a week   - Pt does not like cooking   - Craves food mid afternoon  - Craves sweet and savory  and carbs.  - Snacks after dinner   - Snacks on cheese and crackers occasionally   - Skips breakfast and lunch   - Drinks caloric beverages like: protein shakes and one glass of wine 1 time a month.   - Worst food habit is eating rapidly   - She eats in front of the TV and eats when stressed.    Subjective:   This is the patient's first visit at [facility].  The patient's NEW PATIENT PACKET that they filled out prior to today's office visit was reviewed at length and information from that paperwork was included within the following office visit note.    Included in the packet: current and past health history, medications, allergies, ROS, gynecologic history (women only), surgical history, family history, social history, weight history, weight loss surgery history (for those that have had weight loss surgery), nutritional evaluation, mood and food questionnaire along with a depression screening (PHQ9) on all patients, an Epworth questionnaire, sleep habits questionnaire, patient life and health improvement goals questionnaire. These will all be scanned into the patient's chart under the media tab.   Review of Systems: Please refer to new patient packet scanned into media. Pertinent positives were addressed with patient today.  Reviewed  by clinician on day of visit: allergies, medications, problem list, medical history, surgical history, family history, social history, and previous encounter notes.  During the visit, I independently reviewed the patient's EKG, bioimpedance scale results, and indirect calorimeter results. I used this information to tailor a meal plan for the patient that will help Mary Montgomery to lose weight and will improve her obesity-related conditions going forward.  I performed a medically necessary appropriate examination and/or evaluation. I discussed the assessment and treatment plan with the patient. The patient was provided an opportunity to ask questions and all were answered. The patient agreed with the plan and demonstrated an understanding of the instructions. Labs were ordered today (unless patient declined them) and will be reviewed with the patient at our next visit unless more critical results need to be addressed immediately. Clinical information was updated and documented in the EMR.    Objective:   PHYSICAL EXAM: Blood pressure (!) 158/91, pulse 77, temperature 98.1 F (36.7 C), height 5' 2 (1.575 m), weight 259 lb (117.5 kg), last menstrual period 10/01/2013, SpO2 97%. Body mass index is 47.37 kg/m.  General: Well Developed, well nourished, and in no acute distress.  HEENT: Normocephalic, atraumatic; EOMI, sclerae are anicteric. Skin: Warm and dry, good turgor Chest:  Normal excursion, shape, no gross ABN Respiratory: No conversational dyspnea; speaking in full sentences NeuroM-Sk:  Normal gross ROM * 4 extremities  Psych: A and O *3, insight adequate, mood- full   Anthropometric Measurements Height: 5' 2 (1.575 m) Weight: 259 lb (117.5 kg) BMI (Calculated): 47.36 Weight at Last Visit: 259 lb Weight Lost Since Last Visit: N/A Weight Gained Since Last Visit: N/A Starting Weight: 259 lb Waist Measurement : 45 inches   Body Composition  Body Fat %: 56.2 % Fat Mass (lbs):  145.8 lbs Muscle Mass (lbs): 107.8 lbs Visceral Fat Rating : 21   Other Clinical Data RMR: 1570 Fasting: Yes Labs: Yes Today's Visit #: 1 Starting Date: 01/25/24 Comments: First Visit    DIAGNOSTIC DATA REVIEWED:  BMET    Component Value Date/Time   NA 142 09/01/2023 1246   K 4.1 09/01/2023 1246   CL 104 09/01/2023 1246   CO2 28 09/01/2023 1246   GLUCOSE  85 09/01/2023 1246   BUN 13 09/01/2023 1246   CREATININE 0.51 09/01/2023 1246   CALCIUM  9.3 09/01/2023 1246   GFRNONAA >60 09/01/2023 1246   GFRAA >60 01/21/2018 0910   Lab Results  Component Value Date   HGBA1C 5.8 08/01/2023   HGBA1C 5.6 10/07/2013   No results found for: INSULIN Lab Results  Component Value Date   TSH 1.72 08/01/2023   CBC    Component Value Date/Time   WBC 6.2 09/01/2023 1246   RBC 4.70 09/01/2023 1246   HGB 14.3 09/01/2023 1246   HCT 42.9 09/01/2023 1246   PLT 183 09/01/2023 1246   MCV 91.3 09/01/2023 1246   MCH 30.4 09/01/2023 1246   MCHC 33.3 09/01/2023 1246   RDW 13.5 09/01/2023 1246   Iron Studies No results found for: IRON, TIBC, FERRITIN, IRONPCTSAT Lipid Panel     Component Value Date/Time   CHOL 183 08/01/2023 0917   TRIG 60.0 08/01/2023 0917   HDL 80.10 08/01/2023 0917   CHOLHDL 2 08/01/2023 0917   VLDL 12.0 08/01/2023 0917   LDLCALC 91 08/01/2023 0917   Hepatic Function Panel     Component Value Date/Time   PROT 6.8 09/01/2023 1246   ALBUMIN 4.1 09/01/2023 1246   AST 22 09/01/2023 1246   ALT 18 09/01/2023 1246   ALKPHOS 63 09/01/2023 1246   BILITOT 0.5 09/01/2023 1246   BILIDIR 0.1 08/01/2023 0917      Component Value Date/Time   TSH 1.72 08/01/2023 0917   Nutritional Lab Results  Component Value Date   VD25OH 31.64 08/01/2023   VD25OH 58.98 09/16/2021   VD25OH 55.38 02/13/2019    Attestation Statements:   LILLETTE Sonny Laroche, acting as a stage manager for Mary Jenkins, DO., have compiled all relevant documentation for today's  office visit on behalf of Mary Jenkins, DO, while in the presence of [employer], DO.  I have spent 44 minutes in the care of the patient today including: 32 minutes face-to-face assessing and reviewing listed medical problems above as outlined in office visit note, providing nutritional and behavioral counseling as outlined in obesity care plan, independently interpreting results and goals of care, see listed medical problems, and discussing biometric information and progress. 12 minutes was spent on review of chart/new patient packet and additional post-visit documentation.  I have reviewed the above documentation for accuracy and completeness, and I agree with the above. Mary JINNY Montgomery, D.O.  The 21st Century Cures Act was signed into law in 2016 which includes the topic of electronic health records.  This provides immediate access to information in MyChart.  This includes consultation notes, operative notes, office notes, lab results and pathology reports.  If you have any questions about what you read please let us  know at your next visit so we can discuss your concerns and take corrective action if need be.  We are right here with you.

## 2024-01-26 LAB — COMPREHENSIVE METABOLIC PANEL WITH GFR
ALT: 18 IU/L (ref 0–32)
AST: 20 IU/L (ref 0–40)
Albumin: 4.5 g/dL (ref 3.9–4.9)
Alkaline Phosphatase: 63 IU/L (ref 49–135)
BUN/Creatinine Ratio: 25 (ref 12–28)
BUN: 15 mg/dL (ref 8–27)
Bilirubin Total: 0.5 mg/dL (ref 0.0–1.2)
CO2: 25 mmol/L (ref 20–29)
Calcium: 9.9 mg/dL (ref 8.7–10.3)
Chloride: 101 mmol/L (ref 96–106)
Creatinine, Ser: 0.6 mg/dL (ref 0.57–1.00)
Globulin, Total: 2.3 g/dL (ref 1.5–4.5)
Glucose: 87 mg/dL (ref 70–99)
Potassium: 4.6 mmol/L (ref 3.5–5.2)
Sodium: 143 mmol/L (ref 134–144)
Total Protein: 6.8 g/dL (ref 6.0–8.5)
eGFR: 99 mL/min/1.73 (ref >59)

## 2024-01-26 LAB — FOLATE: Folate: 6.3 ng/mL (ref >3.0)

## 2024-01-26 LAB — CBC WITH DIFFERENTIAL/PLATELET
Basophils Absolute: 0 x10E3/uL (ref 0.0–0.2)
Basos: 1 %
EOS (ABSOLUTE): 0.1 x10E3/uL (ref 0.0–0.4)
Eos: 1 %
Hematocrit: 49.1 % — ABNORMAL HIGH (ref 34.0–46.6)
Hemoglobin: 15.8 g/dL (ref 11.1–15.9)
Immature Grans (Abs): 0 x10E3/uL (ref 0.0–0.1)
Immature Granulocytes: 0 %
Lymphocytes Absolute: 1.3 x10E3/uL (ref 0.7–3.1)
Lymphs: 22 %
MCH: 30.4 pg (ref 26.6–33.0)
MCHC: 32.2 g/dL (ref 31.5–35.7)
MCV: 95 fL (ref 79–97)
Monocytes Absolute: 0.3 x10E3/uL (ref 0.1–0.9)
Monocytes: 6 %
Neutrophils Absolute: 4 x10E3/uL (ref 1.4–7.0)
Neutrophils: 70 %
Platelets: 191 x10E3/uL (ref 150–450)
RBC: 5.19 x10E6/uL (ref 3.77–5.28)
RDW: 13.3 % (ref 11.7–15.4)
WBC: 5.6 x10E3/uL (ref 3.4–10.8)

## 2024-01-26 LAB — LIPID PANEL
Chol/HDL Ratio: 2.3 ratio (ref 0.0–4.4)
Cholesterol, Total: 198 mg/dL (ref 100–199)
HDL: 85 mg/dL (ref >39)
LDL Chol Calc (NIH): 95 mg/dL (ref 0–99)
Triglycerides: 102 mg/dL (ref 0–149)
VLDL Cholesterol Cal: 18 mg/dL (ref 5–40)

## 2024-01-26 LAB — VITAMIN B12: Vitamin B-12: 1904 pg/mL — ABNORMAL HIGH (ref 232–1245)

## 2024-01-26 LAB — T4, FREE: Free T4: 1.24 ng/dL (ref 0.82–1.77)

## 2024-01-26 LAB — TSH: TSH: 1.4 u[IU]/mL (ref 0.450–4.500)

## 2024-01-26 LAB — HEMOGLOBIN A1C
Est. average glucose Bld gHb Est-mCnc: 117 mg/dL
Hgb A1c MFr Bld: 5.7 % — ABNORMAL HIGH (ref 4.8–5.6)

## 2024-01-26 LAB — INSULIN, RANDOM: INSULIN: 7.8 u[IU]/mL (ref 2.6–24.9)

## 2024-01-26 LAB — VITAMIN D 25 HYDROXY (VIT D DEFICIENCY, FRACTURES): Vit D, 25-Hydroxy: 40.3 ng/mL (ref 30.0–100.0)

## 2024-01-31 ENCOUNTER — Ambulatory Visit: Admitting: Family Medicine

## 2024-01-31 ENCOUNTER — Encounter: Payer: Self-pay | Admitting: Family Medicine

## 2024-01-31 DIAGNOSIS — K649 Unspecified hemorrhoids: Secondary | ICD-10-CM | POA: Diagnosis not present

## 2024-01-31 DIAGNOSIS — E785 Hyperlipidemia, unspecified: Secondary | ICD-10-CM | POA: Diagnosis not present

## 2024-01-31 MED ORDER — ROSUVASTATIN CALCIUM 10 MG PO TABS: ORAL_TABLET | ORAL | 3 refills | Status: AC

## 2024-01-31 NOTE — Progress Notes (Signed)
   Subjective:    Patient ID: Mary Montgomery, female    DOB: April 18, 1955, 68 y.o.   MRN: [id number]  HPI Hyperlipidemia- chronic problem, on Crestor  10mg  daily.  LDL on 10/23 95, HDL 85, Trigs 102.  No CP, SOB, abd pain, N/V.  Obesity- ongoing issue for pt.  Weight and BMI are stable at 259 and 47.37 respectively.  Currently going to [facility]  Hemorrhoids- pt reports sxs started ~6-8 weeks ago.  Had a difficult time using the bathroom and noticed blood on toilet tissue.  Got OTC Hemorrhoid medications and the bleeding stopped but still had issues w/ constipation.  Added OTC stool softener w/ good relief.   Review of Systems For ROS see HPI     Objective:   Physical Exam Vitals reviewed.  Constitutional:      General: She is not in acute distress.    Appearance: Normal appearance. She is well-developed. She is obese. She is not ill-appearing.  HENT:     Head: Normocephalic and atraumatic.  Eyes:     Conjunctiva/sclera: Conjunctivae normal.     Pupils: Pupils are equal, round, and reactive to light.  Neck:     Thyroid : No thyromegaly.  Cardiovascular:     Rate and Rhythm: Normal rate and regular rhythm.     Pulses: Normal pulses.     Heart sounds: Normal heart sounds. No murmur heard. Pulmonary:     Effort: Pulmonary effort is normal. No respiratory distress.     Breath sounds: Normal breath sounds.  Abdominal:     General: There is no distension.     Palpations: Abdomen is soft.     Tenderness: There is no abdominal tenderness.  Musculoskeletal:     Cervical back: Normal range of motion and neck supple.     Right lower leg: Edema present.     Left lower leg: Edema present.  Lymphadenopathy:     Cervical: No cervical adenopathy.  Skin:    General: Skin is warm and dry.  Neurological:     General: No focal deficit present.     Mental Status: She is alert and oriented to person, place, and time.  Psychiatric:        Mood and Affect: Mood normal.         Behavior: Behavior normal.        Thought Content: Thought content normal.           Assessment & Plan:

## 2024-01-31 NOTE — Patient Instructions (Signed)
 Schedule your complete physical in 6 months No need to repeat labs at this time- yay! Continue to work on healthy diet and regular exercise- you can do it! Use the stool softeners as needed for constipation Keep up the good work on water intake- you're doing great! Call with any questions or concerns Stay Safe!  Stay Healthy! Happy Halloween!!

## 2024-01-31 NOTE — Assessment & Plan Note (Signed)
 Ongoing issue for pt.  Weight and BMI are stable.  Following w/ [facility].  Reviewed recent labs.  No need to repeat.  Will follow.

## 2024-01-31 NOTE — Assessment & Plan Note (Signed)
 Chronic problem.  On Crestor  10mg  daily w/ good control.  Tolerating w/o difficulty.  Will continue to monitor.

## 2024-01-31 NOTE — Assessment & Plan Note (Signed)
 New.  Pt has hx of similar.  First noticed 6-8 weeks ago when she had constipation and blood on the toilet tissue.  OTC medication stopped bleeding but she continued to struggle w/ constipation.  Got good relief w/ stool softeners.  Told her she was able to continue stool softeners prn.  Pt expressed understanding and is in agreement w/ plan.

## 2024-02-06 ENCOUNTER — Ambulatory Visit (INDEPENDENT_AMBULATORY_CARE_PROVIDER_SITE_OTHER): Payer: Self-pay | Admitting: Family Medicine

## 2024-02-06 ENCOUNTER — Encounter (INDEPENDENT_AMBULATORY_CARE_PROVIDER_SITE_OTHER): Payer: Self-pay | Admitting: Family Medicine

## 2024-02-06 VITALS — BP 123/84 | HR 78 | Temp 97.8°F | Ht 62.0 in | Wt 253.0 lb

## 2024-02-06 DIAGNOSIS — Z6841 Body Mass Index (BMI) 40.0 and over, adult: Secondary | ICD-10-CM

## 2024-02-06 DIAGNOSIS — E559 Vitamin D deficiency, unspecified: Secondary | ICD-10-CM | POA: Diagnosis not present

## 2024-02-06 DIAGNOSIS — E669 Obesity, unspecified: Secondary | ICD-10-CM | POA: Diagnosis not present

## 2024-02-06 DIAGNOSIS — E785 Hyperlipidemia, unspecified: Secondary | ICD-10-CM

## 2024-02-06 DIAGNOSIS — R030 Elevated blood-pressure reading, without diagnosis of hypertension: Secondary | ICD-10-CM

## 2024-02-06 DIAGNOSIS — R7303 Prediabetes: Secondary | ICD-10-CM

## 2024-02-06 DIAGNOSIS — E66813 Obesity, class 3: Secondary | ICD-10-CM

## 2024-02-06 NOTE — Progress Notes (Signed)
 Barnie DOROTHA Jenkins, D.O.  ABFM, ABOM Clinical Bariatric Medicine Physician  Office located at: [address] [place], [place]  [address]    FOR THE CHRONIC DISEASE OF OBESITY:  Chief complaint: Obesity Mary Montgomery is here to discuss her progress with her obesity treatment plan.   History of present illness / Interval history:  Mary Montgomery is here today for her first follow-up office visit since starting the program with us . Patient is off to a good start and has lost 6 lbs. Patient states that she got bored of the food like sandwiches and salads. She endorses eating sandwiches and left over protein for dinner. She eats chicken and white ground turkey usually.     01/25/24 02/06/24 11:00   Body Fat % 56.2 % 55.9 %  Muscle Mass (lbs) 107.8 lbs 106 lbs  Fat Mass (lbs) 145.8 lbs 141.6 lbs  Total Body Water (lbs)  85.6 lbs  Visceral Fat Rating  21 21   Counseling done today with patient on how various foods will affect these numbers and how to maximize fat loss success   Total lbs lost to date: - 6 lbs Total Fat Mass in lbs lost to date: - 11.2 lbs Total weight loss percentage to date: - 2.32 %    obesity (BMI) of 45.0 to 49.9 in adult [facility]) start 47.36 Obesity (HCC) current 46.26  Nutrition Therapy She is on the Category 1 Plan w/ lunch options and states she is following her eating plan approximately 95 % of the time.   - Tracking Calories/Macros: yes  - Eating More Whole Foods: yes  - Adequate Protein Intake: yes  - Adequate Water Intake: yes  - Skipping Meals: no   - Sleeping 7-9 Hours/ Night: yes  Mary Montgomery is currently in the action stage of change. As such, her goal is to continue weight management plan.  She has agreed to: continue current plan   Physical Activity Mary Montgomery is stretching, 20 minutes, 2 days per wk currently  Mary Montgomery has been advised to work up to 300-450 minutes of moderate intensity aerobic activity a week and strengthening  exercises 2-3 times per week for cardiovascular health, weight loss maintenance and preservation of muscle mass.  She has agreed to : Think about enjoyable ways to increase daily physical activity and overcoming barriers to exercise and Continue to gradually increase the amount and intensity of exercise routine   Behavioral Modifications Evidence-based interventions for health behavior change were utilized today including the discussion of  1) self monitoring techniques:    - Continue breaking meals into meals  2) SMART goals for next OV:    - Increase water to about 80-100 oz   Regarding patient's less desirable eating habits and patterns, we employed the technique of small changes.   We discussed the following today: increasing lean protein intake to established goals, planning for success, and focusing on food with a 10:1 ratio of calories: grams of protein Additional resources provided today: Handout on CAT 1-2 breakfast options, Handout on Pre-Diabetes education , and Handout on insulin resistance education   Medical Interventions/ Pharmacotherapy Previous Bariatric surgery: n/a Pharmacotherapy for weight loss: She is not currently taking medications  for medical weight loss.    We discussed various medication options to help Mary Montgomery with her weight loss efforts and we both agreed to : Continue with current nutritional and behavioral strategies   OBESITY RELATED CONDITIONS ADDRESSED TODAY: Extensive review of labs performed today that were  drawn at the new patient office visit on 01/25/24.   Prediabetes Assessment & Plan Lab Results  Component Value Date   HGBA1C 5.7 (H) 01/25/2024   HGBA1C 5.8 08/01/2023   HGBA1C 5.7 11/25/2022   INSULIN 7.8 01/25/2024   CMP     Component Value Date/Time   NA 143 01/25/2024 0943   K 4.6 01/25/2024 0943   CL 101 01/25/2024 0943   CO2 25 01/25/2024 0943   GLUCOSE 87 01/25/2024 0943   GLUCOSE 85 09/01/2023 1246   BUN 15 01/25/2024 0943    CREATININE 0.60 01/25/2024 0943   CALCIUM  9.9 01/25/2024 0943   PROT 6.8 01/25/2024 0943   ALBUMIN 4.5 01/25/2024 0943   AST 20 01/25/2024 0943   ALT 18 01/25/2024 0943   ALKPHOS 63 01/25/2024 0943   BILITOT 0.5 01/25/2024 0943   GFR 95.85 08/01/2023 0917   EGFR 99 01/25/2024 0943   GFRNONAA >60 09/01/2023 1246  CBC    Component Value Date/Time   WBC 5.6 01/25/2024 0943   WBC 6.2 09/01/2023 1246   RBC 5.19 01/25/2024 0943   RBC 4.70 09/01/2023 1246   HGB 15.8 01/25/2024 0943   HCT 49.1 (H) 01/25/2024 0943   PLT 191 01/25/2024 0943   MCV 95 01/25/2024 0943   MCH 30.4 01/25/2024 0943   MCH 30.4 09/01/2023 1246   MCHC 32.2 01/25/2024 0943   MCHC 33.3 09/01/2023 1246   RDW 13.3 01/25/2024 0943   LYMPHSABS 1.3 01/25/2024 0943   MONOABS 0.4 08/01/2023 0917   EOSABS 0.1 01/25/2024 0943   BASOSABS 0.0 01/25/2024 0943   Lab Results  Component Value Date   TSH 1.400 01/25/2024   FREET4 1.24 01/25/2024   Diet and life style controlled. Patients A1C has decreased from 5.8 to 5.7. Insulin level is currently 7.8 and goal levels is 5 or less. Explained to patient how insulin resistance works and that as patient sheds adipose tissue insulin levels will decrease as well. Answered any pertinent questions. Continue following prudent meal plan and decreasing simple carbs.   Patients CMP is WNL. Electrolytes and liver enzymes are WNL. No acute concerns today. Salt levels are a little elevated nothing to be concerned about at this moment. Continue increasing water intake to half of body weight in oz     Hyperlipidemia, unspecified hyperlipidemia type Assessment & Plan Lab Results  Component Value Date   CHOL 198 01/25/2024   HDL 85 01/25/2024   LDLCALC 95 01/25/2024   TRIG 102 01/25/2024   CHOLHDL 2.3 01/25/2024   On Crestor  10 mg daily. Good compliance and tolerance. Lipid panel is at goal. HDL levels will increase as exercise increases, HDL level is currently at 85. LDL levels are  at goal. Continue to decrease trans and saturated fats and fatty/fried meals to continue to decrease LDL levels. Trig levels are at goal (less than 150). Continue following prudent meal plan and decreasing foods high in trans and saturated fat.      Elevated blood pressure reading- white coat syndrome Assessment & Plan BP Readings from Last 3 Encounters:  02/06/24 123/84  01/31/24 112/80  01/25/24 (!) 148/90   The 10-year ASCVD risk score (Arnett DK, et al., 2019) is: 6.5%  Lab Results  Component Value Date   CREATININE 0.60 01/25/2024   Diet and life style controlled. Patients BP was elevated at last OV. Patient brought in her BP from home systolic ranges from 105 to 128 and diastolic ranged from 74 to 84. Patient endorses seeing  her cardiologist and said that her BP reading was okay and normal. Encouraged patient to check BP a couple times a week and mostly if patient is feeling dizzy or lightheaded. Continue following prudent meal plan and decreasing foods high in sodium.     Vitamin D  deficiency Assessment & Plan Lab Results  Component Value Date   VD25OH 40.3 01/25/2024   VD25OH 31.64 08/01/2023   VD25OH 58.98 09/16/2021  Last vitamin B12 and Folate Lab Results  Component Value Date   VITAMINB12 1,904 (H) 01/25/2024   FOLATE 6.3 01/25/2024   Was previously taking OTC B12 but discontinued used once she saw that her B12 was elevated.   Taking Cholecalciferol 1000 lU daily. Vit D levels are suboptimal. Patient recommended to take 2 tablets every day. Explaind to pt as she sheds adipose tissue her Vit D levels will rise naturally. Will obtain labs as medically necessary.              FOLLOW UP:   Return 02/21/2024 at 2:40 PM  She was informed of the importance of frequent follow up visits to maximize her success with intensive lifestyle modifications for her multiple health conditions.   Weight Summary and Biometrics   Weight Lost Since Last Visit: 6lb  Weight  Gained Since Last Visit: 0lb   Vitals Temp: 97.8 F (36.6 C) BP: 123/84 Pulse Rate: 78 SpO2: 97 %   Anthropometric Measurements Height: 5' 2 (1.575 m) Weight: 253 lb (114.8 kg) BMI (Calculated): 46.26 Weight at Last Visit: 259lb Weight Lost Since Last Visit: 6lb Weight Gained Since Last Visit: 0lb Starting Weight: 259lb Total Weight Loss (lbs): 6 lb (2.722 kg) Peak Weight: 304lb   Body Composition  Body Fat %: 55.9 % Fat Mass (lbs): 141.6 lbs Muscle Mass (lbs): 106 lbs Total Body Water (lbs): 85.6 lbs Visceral Fat Rating : 21   Other Clinical Data Fasting: no Labs: no Today's Visit #: 2 Starting Date: 01/25/24 Comments: 1st FU     Objective:  Review of Systems:  Pertinent positives were addressed with patient today.   Reviewed by clinician on day of visit: allergies, medications, problem list, medical history, surgical history, family history, social history, and previous encounter notes.  PHYSICAL EXAM: Blood pressure 123/84, pulse 78, temperature 97.8 F (36.6 C), height 5' 2 (1.575 m), weight 253 lb (114.8 kg), last menstrual period 10/01/2013, SpO2 97%. Body mass index is 46.27 kg/m. General: she is overweight, cooperative and in no acute distress.   HEENT: EOMI, sclerae are anicteric. Lungs: Normal breathing effort, no conversational dyspnea. M-Sk:  Normal gross ROM * 4 extremities  PSYCH: Has normal mood, affect and thought process. Neurologic: No gross sensory or motor deficits. Well developed, A and O * 3  DIAGNOSTIC DATA REVIEWED:  BMET    Component Value Date/Time   NA 143 01/25/2024 0943   K 4.6 01/25/2024 0943   CL 101 01/25/2024 0943   CO2 25 01/25/2024 0943   GLUCOSE 87 01/25/2024 0943   GLUCOSE 85 09/01/2023 1246   BUN 15 01/25/2024 0943   CREATININE 0.60 01/25/2024 0943   CALCIUM  9.9 01/25/2024 0943   GFRNONAA >60 09/01/2023 1246   GFRAA >60 01/21/2018 0910   Lab Results  Component Value Date   HGBA1C 5.7 (H) 01/25/2024    HGBA1C 5.6 10/07/2013   Lab Results  Component Value Date   INSULIN 7.8 01/25/2024   Lab Results  Component Value Date   TSH 1.400 01/25/2024   CBC  Component Value Date/Time   WBC 5.6 01/25/2024 0943   WBC 6.2 09/01/2023 1246   RBC 5.19 01/25/2024 0943   RBC 4.70 09/01/2023 1246   HGB 15.8 01/25/2024 0943   HCT 49.1 (H) 01/25/2024 0943   PLT 191 01/25/2024 0943   MCV 95 01/25/2024 0943   MCH 30.4 01/25/2024 0943   MCH 30.4 09/01/2023 1246   MCHC 32.2 01/25/2024 0943   MCHC 33.3 09/01/2023 1246   RDW 13.3 01/25/2024 0943   Iron Studies No results found for: IRON, TIBC, FERRITIN, IRONPCTSAT Lipid Panel     Component Value Date/Time   CHOL 198 01/25/2024 0943   TRIG 102 01/25/2024 0943   HDL 85 01/25/2024 0943   CHOLHDL 2.3 01/25/2024 0943   CHOLHDL 2 08/01/2023 0917   VLDL 12.0 08/01/2023 0917   LDLCALC 95 01/25/2024 0943   Hepatic Function Panel     Component Value Date/Time   PROT 6.8 01/25/2024 0943   ALBUMIN 4.5 01/25/2024 0943   AST 20 01/25/2024 0943   ALT 18 01/25/2024 0943   ALKPHOS 63 01/25/2024 0943   BILITOT 0.5 01/25/2024 0943   BILIDIR 0.1 08/01/2023 0917      Component Value Date/Time   TSH 1.400 01/25/2024 0943   Nutritional Lab Results  Component Value Date   VD25OH 40.3 01/25/2024   VD25OH 31.64 08/01/2023   VD25OH 58.98 09/16/2021     Attestations:   I, ***, acting as a stage manager for Barnie Jenkins, DO., have compiled all relevant documentation for today's office visit on behalf of Barnie Jenkins, DO, while in the presence of [employer], DO.  I have spent 45 minutes in the care of the patient today.  34 minutes was spent on face-to-face counseling and reviewing listed medical problems above as outlined in office visit note, providing nutritional and behavioral counseling as outlined in obesity care plan, independently interpreting results and goals of care, see listed medical problems, and discussing  biometric information and progress. We reviewed *** meal plan and discussed how the foods *** eating is affecting each one of *** labs. Pt educated on why we want *** to eat various foods in various amounts and has a better understanding of the nutritional plan because of this. All *** questions were answered today. *** minutes was spent on pre-chart review and additional documentation after the visit.   I have reviewed the above documentation for accuracy and completeness, and I agree with the above. Barnie JINNY Jenkins, D.O.  The 21st Century Cures Act was signed into law in 2016 which includes the topic of electronic health records.  This provides immediate access to information in MyChart.  This includes consultation notes, operative notes, office notes, lab results and pathology reports.  If you have any questions about what you read please let us  know at your next visit so we can discuss your concerns and take corrective action if need be.  We are right here with you.

## 2024-02-21 ENCOUNTER — Ambulatory Visit (INDEPENDENT_AMBULATORY_CARE_PROVIDER_SITE_OTHER): Payer: Self-pay | Admitting: Family Medicine

## 2024-02-21 ENCOUNTER — Encounter (INDEPENDENT_AMBULATORY_CARE_PROVIDER_SITE_OTHER): Payer: Self-pay | Admitting: Family Medicine

## 2024-02-21 VITALS — BP 131/89 | HR 72 | Temp 98.0°F | Ht 62.0 in | Wt 251.0 lb

## 2024-02-21 DIAGNOSIS — R7303 Prediabetes: Secondary | ICD-10-CM | POA: Diagnosis not present

## 2024-02-21 DIAGNOSIS — E559 Vitamin D deficiency, unspecified: Secondary | ICD-10-CM | POA: Diagnosis not present

## 2024-02-21 DIAGNOSIS — R030 Elevated blood-pressure reading, without diagnosis of hypertension: Secondary | ICD-10-CM | POA: Diagnosis not present

## 2024-02-21 DIAGNOSIS — E669 Obesity, unspecified: Secondary | ICD-10-CM

## 2024-02-21 DIAGNOSIS — Z6841 Body Mass Index (BMI) 40.0 and over, adult: Secondary | ICD-10-CM | POA: Diagnosis not present

## 2024-02-21 DIAGNOSIS — E66813 Obesity, class 3: Secondary | ICD-10-CM

## 2024-02-21 NOTE — Progress Notes (Signed)
 Mary Montgomery, D.O.  ABFM, ABOM Specializing in Clinical Bariatric Medicine  Office located at: [address] [place], [place]  [address]    FOR THE CHRONIC DISEASE OF OBESITY:   Obesity (BMI) of 45.0 to 49.9 in adult [facility]) start 47.36 Obesity (HCC) current 45.9  Weight Summary and Body Composition Analysis  Weight Lost Since Last Visit: 2lb  Weight Gained Since Last Visit: 0lb    Vitals Temp: 98 F (36.7 C) BP: 131/89 Pulse Rate: 72 SpO2: 97 %   Anthropometric Measurements Height: 5' 2 (1.575 m) Weight: 251 lb (113.9 kg) BMI (Calculated): 45.9 Weight at Last Visit: 253lb Weight Lost Since Last Visit: 2lb Weight Gained Since Last Visit: 0lb Starting Weight: 259lb Total Weight Loss (lbs): 8 lb (3.629 kg) Peak Weight: 304lb   Body Composition  Body Fat %: 56.8 % Fat Mass (lbs): 142.8 lbs Muscle Mass (lbs): 103 lbs Visceral Fat Rating : 21   Other Clinical Data Fasting: no Labs: no Today's Visit #: 3 Starting Date: 01/25/24    Chief complaint: Obesity  Interval History Mary Montgomery is here for a follow-up office visit to discuss her progress with her obesity treatment plan. She is on the Category 1 Plan and states she is following her eating plan approximately 85 % of the time. She is walking, stretching, and doing chair exercises 20  minutes 2 days per week  She has experienced a weight loss of 2 lbs since last OV on 02/06/2024.   Her dietary and life habits include:  - Tracking Calories/Macros: yes  - Eating More Whole Foods: yes  - Adequate Protein Intake: yes  - Adequate Water Intake: yes - drinking 80 to 100 ounces of water daily.   - She sometimes skips certain foods on her meal plan or 1 meal a day when busy.  - Sleeping 7-9 Hours/ Night: yes    02/06/24 11:00 02/21/24 14:00   Body Fat % 55.9 % 56.8 %  Muscle Mass (lbs) 106 lbs 103 lbs  Fat Mass (lbs) 141.6 lbs 142.8 lbs  Total Body Water (lbs) 85.6 lbs   Visceral  Fat Rating  21 21   Counseling done on how various foods will affect these numbers and how to maximize success  Total Fat mass lost to date: - 3 lbs Total lbs lost to date: - 8 lbs Total weight loss percentage to date: - 3.09 %   Nutritional and Behavioral Counseling:  We discussed the following today: increasing lean protein intake to established goals, avoiding skipping meals, continue to work on implementation of reduced calorie nutritional plan, eating multiple small meals a day to get in all their foods, and celebration eating strategies  Additional resources provided today: Handout on holiday eating strategies  Evidence-based interventions for health behavior change were utilized today including the discussion of self monitoring techniques, problem-solving barriers and SMART goal setting techniques.   Regarding patient's less desirable eating habits and patterns, we employed the technique of small changes.   SMART Goal(s) created today: avoid skipping meals   Recommended Dietary Goals Harlowe is currently in the action stage of change. As such, her goal is to continue weight management plan.  She has agreed to continue the CAT 1 MP.   Recommended Physical Activity Goals Marceil has been advised to work up to 300-450 minutes of moderate intensity aerobic activity a week and strengthening exercises 2-3 times per week for cardiovascular health, weight loss maintenance and preservation of muscle mass.  She was encouraged to do some form of exercise 20 minutes daily.   Medical Interventions and Pharmacotherapy Previous Bariatric surgery: n/a Pharmacotherapy: Continue with current nutritional and behavioral strategies    OBESITY RELATED CONDITIONS ADDRESSED TODAY:    Prediabetes Assessment & Plan: Lab Results  Component Value Date   HGBA1C 5.7 (H) 01/25/2024   HGBA1C 5.8 08/01/2023   HGBA1C 5.7 11/25/2022   INSULIN  7.8 01/25/2024    Pre-DM managed with dietary and  lifestyle interventions. Hunger and cravings are stable when adhering to prudent nutritional plan. No acute concerns today. Continue working on nutrition plan to decrease simple carbohydrates, increase lean proteins and exercise to promote weight loss and prevent progression to T2DM.    Vitamin D  deficiency Assessment & Plan: Lab Results  Component Value Date   VD25OH 40.3 01/25/2024   VD25OH 31.64 08/01/2023   VD25OH 58.98 09/16/2021   She is doing well on OTC Vit D 2,000 units daily; continue wt loss efforts and supplementation.  Recheck as deemed medically necessary.    Elevated blood pressure reading- white coat syndrome Assessment & Plan: BP Readings from Last 3 Encounters:  02/21/24 131/89  02/06/24 123/84  01/31/24 112/80   BP stable today. Pt asx and feels completely fine. Continue with low sodium heart healthy diet. Advance exercise as tolerated.    Objective:   PHYSICAL EXAM: Blood pressure 131/89, pulse 72, temperature 98 F (36.7 C), height 5' 2 (1.575 m), weight 251 lb (113.9 kg), last menstrual period 10/01/2013, SpO2 97%. Body mass index is 45.91 kg/m.  General: she is overweight, cooperative and in no acute distress. PSYCH: Has normal mood, affect and thought process.   HEENT: EOMI, sclerae are anicteric. Lungs: Normal breathing effort, no conversational dyspnea. Extremities: Moves * 4 Neurologic: A and O * 3, good insight  DIAGNOSTIC DATA REVIEWED: BMET    Component Value Date/Time   NA 143 01/25/2024 0943   K 4.6 01/25/2024 0943   CL 101 01/25/2024 0943   CO2 25 01/25/2024 0943   GLUCOSE 87 01/25/2024 0943   GLUCOSE 85 09/01/2023 1246   BUN 15 01/25/2024 0943   CREATININE 0.60 01/25/2024 0943   CALCIUM  9.9 01/25/2024 0943   GFRNONAA >60 09/01/2023 1246   GFRAA >60 01/21/2018 0910   Lab Results  Component Value Date   HGBA1C 5.7 (H) 01/25/2024   HGBA1C 5.6 10/07/2013   Lab Results  Component Value Date   INSULIN  7.8 01/25/2024    Lab Results  Component Value Date   TSH 1.400 01/25/2024   CBC    Component Value Date/Time   WBC 5.6 01/25/2024 0943   WBC 6.2 09/01/2023 1246   RBC 5.19 01/25/2024 0943   RBC 4.70 09/01/2023 1246   HGB 15.8 01/25/2024 0943   HCT 49.1 (H) 01/25/2024 0943   PLT 191 01/25/2024 0943   MCV 95 01/25/2024 0943   MCH 30.4 01/25/2024 0943   MCH 30.4 09/01/2023 1246   MCHC 32.2 01/25/2024 0943   MCHC 33.3 09/01/2023 1246   RDW 13.3 01/25/2024 0943   Iron Studies No results found for: IRON, TIBC, FERRITIN, IRONPCTSAT Lipid Panel     Component Value Date/Time   CHOL 198 01/25/2024 0943   TRIG 102 01/25/2024 0943   HDL 85 01/25/2024 0943   CHOLHDL 2.3 01/25/2024 0943   CHOLHDL 2 08/01/2023 0917   VLDL 12.0 08/01/2023 0917   LDLCALC 95 01/25/2024 0943   Hepatic Function Panel     Component Value Date/Time   PROT  6.8 01/25/2024 0943   ALBUMIN 4.5 01/25/2024 0943   AST 20 01/25/2024 0943   ALT 18 01/25/2024 0943   ALKPHOS 63 01/25/2024 0943   BILITOT 0.5 01/25/2024 0943   BILIDIR 0.1 08/01/2023 0917      Component Value Date/Time   TSH 1.400 01/25/2024 0943   Nutritional Lab Results  Component Value Date   VD25OH 40.3 01/25/2024   VD25OH 31.64 08/01/2023   VD25OH 58.98 09/16/2021     Follow up:   Return 03/13/2024 at 10:00 AM.  She was informed of the importance of frequent follow up visits to maximize her success with intensive lifestyle modifications for her multiple health conditions.   Attestations:   I, Special Puri, acting as a stage manager for [employer], DO., have compiled all relevant documentation for today's office visit on behalf of Mary Jenkins, DO, while in the presence of [employer], DO.  Pertinent positives were addressed with patient today. Reviewed by clinician on day of visit: allergies, medications, problem list, medical history, surgical history, family history, social history, and previous encounter notes.  I have  reviewed the above documentation for accuracy and completeness, and I agree with the above. Mary Montgomery, D.O.  The 21st Century Cures Act was signed into law in 2016 which includes the topic of electronic health records.  This provides immediate access to information in MyChart. This includes consultation notes, operative notes, office notes, lab results and pathology reports.  If you have any questions about what you read please let us  know at your next visit so we can discuss your concerns and take corrective action if need be.  We are right here with you.

## 2024-03-07 DIAGNOSIS — M1711 Unilateral primary osteoarthritis, right knee: Secondary | ICD-10-CM | POA: Diagnosis not present

## 2024-03-13 ENCOUNTER — Encounter (INDEPENDENT_AMBULATORY_CARE_PROVIDER_SITE_OTHER): Payer: Self-pay | Admitting: Family Medicine

## 2024-03-13 ENCOUNTER — Ambulatory Visit (INDEPENDENT_AMBULATORY_CARE_PROVIDER_SITE_OTHER): Admitting: Family Medicine

## 2024-03-13 VITALS — BP 123/75 | HR 75 | Temp 98.0°F | Ht 62.0 in | Wt 245.0 lb

## 2024-03-13 DIAGNOSIS — R7303 Prediabetes: Secondary | ICD-10-CM | POA: Diagnosis not present

## 2024-03-13 DIAGNOSIS — E65 Localized adiposity: Secondary | ICD-10-CM

## 2024-03-13 DIAGNOSIS — E559 Vitamin D deficiency, unspecified: Secondary | ICD-10-CM

## 2024-03-13 DIAGNOSIS — E669 Obesity, unspecified: Secondary | ICD-10-CM | POA: Diagnosis not present

## 2024-03-13 DIAGNOSIS — Z6841 Body Mass Index (BMI) 40.0 and over, adult: Secondary | ICD-10-CM | POA: Diagnosis not present

## 2024-03-13 DIAGNOSIS — E66813 Obesity, class 3: Secondary | ICD-10-CM

## 2024-03-13 NOTE — Progress Notes (Signed)
 Mary Montgomery, D.O.  ABFM, ABOM Specializing in Clinical Bariatric Medicine  Office located at: [address] [place], [place]  [address]    FOR THE CHRONIC DISEASE OF OBESITY:   Obesity (HCC) current 45.9 BMI 40.0-44.9, adult (HCC) - current BMI 44.8  Weight Summary and Body Composition Analysis  Weight Lost Since Last Visit: 6 lb  Weight Gained Since Last Visit: 0    Vitals Temp: 98 F (36.7 C) BP: 123/75 Pulse Rate: 75 SpO2: 98 %   Anthropometric Measurements Height: 5' 2 (1.575 m) Weight: 245 lb (111.1 kg) BMI (Calculated): 44.8 Weight at Last Visit: 251 lb Weight Lost Since Last Visit: 6 lb Weight Gained Since Last Visit: 0 Starting Weight: 259 lb Total Weight Loss (lbs): 14 lb (6.35 kg) Peak Weight: 304 lb   Body Composition  Body Fat %: 55 % Fat Mass (lbs): 134.8 lbs Muscle Mass (lbs): 104.8 lbs Total Body Water (lbs): 83.8 lbs Visceral Fat Rating : 20   Other Clinical Data Fasting: No Labs: No Today's Visit #: 4 Starting Date: 01/25/24    Chief complaint: Obesity  Interval History Mary Montgomery is here for a follow-up office visit to discuss her progress with her obesity treatment plan. She is on the Category 1 Plan and states she is following her eating plan approximately 90% of the time. She is doing chair exercises 20  minutes 3 days per week  She has experienced a weight loss of 6 lbs since last OV on 02/21/2024.   Her dietary and life habits include:  - Tracking Calories/Macros: yes  - Eating More Whole Foods: yes  - Adequate Protein Intake: yes  - Adequate Water Intake: yes  - Skipping Meals: no  - Sleeping 7-9 Hours/ Night: yes    02/21/24 14:00 03/13/24 09:00   Body Fat % 56.8 % 55 %  Muscle Mass (lbs) 103 lbs 104.8 lbs  Fat Mass (lbs) 142.8 lbs 134.8 lbs  Total Body Water (lbs)  83.8 lbs  Visceral Fat Rating  21 20    Counseling done on how various foods will affect these numbers and how to maximize  success  Total Fat mass lost to date: - 18 lbs Total lbs lost to date: - 14 lbs Total weight loss percentage to date: - 5.41 %   Nutritional and Behavioral Counseling:  We discussed the following today: increasing lean protein intake to established goals, using GPT or another AI platform for recipe ideas- searching low calorie, low carb, high protein chicken recipes etc, and making foods they love in a healthy manner  Additional resources provided today: Handout on holiday recipes,  Handout on slow cooker recipes, Handout on benefits of 5-10% weight loss, and Handout on December Goals  Evidence-based interventions for health behavior change were utilized today including the discussion of self monitoring techniques, problem-solving barriers and SMART goal setting techniques.   Regarding patient's less desirable eating habits and patterns, we employed the technique of small changes.   SMART Goal(s) created today: use AI for recipe ideas + maintain weight through holidays.   Recommended Dietary Goals Mary Montgomery is currently in the action stage of change. As such, her goal is to continue weight management plan.  She has agreed to continue the CAT 1 MP.    Recommended Physical Activity Goals Mary Montgomery has been advised to work up to 300-450 minutes of moderate intensity aerobic activity a week and strengthening exercises 2-3 times per week for cardiovascular health, weight loss  maintenance and preservation of muscle mass.   She was encouraged to do some form of purposeful exercise 20 minutes daily (e.g Walk at Home series)   Medical Interventions and Pharmacotherapy Previous Bariatric surgery: n/a Pharmacotherapy: n/a    OBESITY RELATED CONDITIONS ADDRESSED TODAY:    Prediabetes Assessment & Plan: Lab Results  Component Value Date   HGBA1C 5.7 (H) 01/25/2024   HGBA1C 5.8 08/01/2023   HGBA1C 5.7 11/25/2022   INSULIN  7.8 01/25/2024    Pre-DM managed with dietary and lifestyle  interventions. Hunger and cravings are controlled when adhering to prudent nutritional plan. No acute concerns today. Continue working on nutrition plan to decrease simple carbohydrates, increase lean proteins and exercise to promote weight loss and prevent progression to T2DM.     Visceral obesity Assessment & Plan: Started w/ visceral fat rating of 23 on info session 12/27/2023. Current visceral fat rating: 20.  The visceral fat rating should be < 12 in a female.    Visceral adipose tissue is a hormonally active component of total body fat. This body composition phenotype is associated with medical disorders such as metabolic syndrome, MASLD, cardiovascular disease and several malignancies including prostate, breast, and colorectal cancers. Continue goal of losing 7-10% of weight via prudent nutritional plan and lifestyle changes.     Vitamin D  deficiency Assessment & Plan: Lab Results  Component Value Date   VD25OH 40.3 01/25/2024   VD25OH 31.64 08/01/2023   VD25OH 58.98 09/16/2021   She is doing well on OTC Vit D 2,000 units daily; continue wt loss efforts and supplementation. Recheck as deemed medically necessary.    Objective:   PHYSICAL EXAM: Blood pressure 123/75, pulse 75, temperature 98 F (36.7 C), height 5' 2 (1.575 m), weight 245 lb (111.1 kg), last menstrual period 10/01/2013, SpO2 98%. Body mass index is 44.81 kg/m.  General: she is overweight, cooperative and in no acute distress. PSYCH: Has normal mood, affect and thought process.   HEENT: EOMI, sclerae are anicteric. Lungs: Normal breathing effort, no conversational dyspnea. Extremities: Moves * 4 Neurologic: A and O * 3, good insight  DIAGNOSTIC DATA REVIEWED: BMET    Component Value Date/Time   NA 143 01/25/2024 0943   K 4.6 01/25/2024 0943   CL 101 01/25/2024 0943   CO2 25 01/25/2024 0943   GLUCOSE 87 01/25/2024 0943   GLUCOSE 85 09/01/2023 1246   BUN 15 01/25/2024 0943   CREATININE 0.60  01/25/2024 0943   CALCIUM  9.9 01/25/2024 0943   GFRNONAA >60 09/01/2023 1246   GFRAA >60 01/21/2018 0910   Lab Results  Component Value Date   HGBA1C 5.7 (H) 01/25/2024   HGBA1C 5.6 10/07/2013   Lab Results  Component Value Date   INSULIN  7.8 01/25/2024   Lab Results  Component Value Date   TSH 1.400 01/25/2024   CBC    Component Value Date/Time   WBC 5.6 01/25/2024 0943   WBC 6.2 09/01/2023 1246   RBC 5.19 01/25/2024 0943   RBC 4.70 09/01/2023 1246   HGB 15.8 01/25/2024 0943   HCT 49.1 (H) 01/25/2024 0943   PLT 191 01/25/2024 0943   MCV 95 01/25/2024 0943   MCH 30.4 01/25/2024 0943   MCH 30.4 09/01/2023 1246   MCHC 32.2 01/25/2024 0943   MCHC 33.3 09/01/2023 1246   RDW 13.3 01/25/2024 0943   Iron Studies No results found for: IRON, TIBC, FERRITIN, IRONPCTSAT Lipid Panel     Component Value Date/Time   CHOL 198 01/25/2024 0943  TRIG 102 01/25/2024 0943   HDL 85 01/25/2024 0943   CHOLHDL 2.3 01/25/2024 0943   CHOLHDL 2 08/01/2023 0917   VLDL 12.0 08/01/2023 0917   LDLCALC 95 01/25/2024 0943   Hepatic Function Panel     Component Value Date/Time   PROT 6.8 01/25/2024 0943   ALBUMIN 4.5 01/25/2024 0943   AST 20 01/25/2024 0943   ALT 18 01/25/2024 0943   ALKPHOS 63 01/25/2024 0943   BILITOT 0.5 01/25/2024 0943   BILIDIR 0.1 08/01/2023 0917      Component Value Date/Time   TSH 1.400 01/25/2024 0943   Nutritional Lab Results  Component Value Date   VD25OH 40.3 01/25/2024   VD25OH 31.64 08/01/2023   VD25OH 58.98 09/16/2021     Follow up:   Return 04/09/2024 at 10:40 AM.  She was informed of the importance of frequent follow up visits to maximize her success with intensive lifestyle modifications for her multiple health conditions.   Attestations:   I, Special Puri, acting as a stage manager for [employer], DO., have compiled all relevant documentation for today's office visit on behalf of Mary Jenkins, DO, while in the presence  of [employer], DO.  Pertinent positives were addressed with patient today. Reviewed by clinician on day of visit: allergies, medications, problem list, medical history, surgical history, family history, social history, and previous encounter notes.  I have reviewed the above documentation for accuracy and completeness, and I agree with the above. Mary Montgomery, D.O.  The 21st Century Cures Act was signed into law in 2016 which includes the topic of electronic health records.  This provides immediate access to information in MyChart. This includes consultation notes, operative notes, office notes, lab results and pathology reports.  If you have any questions about what you read please let us  know at your next visit so we can discuss your concerns and take corrective action if need be.  We are right here with you.

## 2024-04-09 ENCOUNTER — Encounter (INDEPENDENT_AMBULATORY_CARE_PROVIDER_SITE_OTHER): Payer: Self-pay

## 2024-04-09 ENCOUNTER — Ambulatory Visit (INDEPENDENT_AMBULATORY_CARE_PROVIDER_SITE_OTHER): Admitting: Family Medicine

## 2024-04-09 ENCOUNTER — Encounter (INDEPENDENT_AMBULATORY_CARE_PROVIDER_SITE_OTHER): Payer: Self-pay | Admitting: Family Medicine

## 2024-04-09 VITALS — BP 130/81 | HR 60 | Temp 98.2°F | Ht 62.0 in | Wt 244.0 lb

## 2024-04-09 DIAGNOSIS — E66813 Obesity, class 3: Secondary | ICD-10-CM

## 2024-04-09 DIAGNOSIS — Z91199 Patient's noncompliance with other medical treatment and regimen due to unspecified reason: Secondary | ICD-10-CM

## 2024-04-09 DIAGNOSIS — Z6841 Body Mass Index (BMI) 40.0 and over, adult: Secondary | ICD-10-CM

## 2024-04-19 NOTE — Progress Notes (Signed)
 Left w/o being seen

## 2024-05-08 ENCOUNTER — Ambulatory Visit (INDEPENDENT_AMBULATORY_CARE_PROVIDER_SITE_OTHER): Admitting: Family Medicine

## 2024-05-21 ENCOUNTER — Ambulatory Visit (INDEPENDENT_AMBULATORY_CARE_PROVIDER_SITE_OTHER): Admitting: Family Medicine

## 2024-06-05 ENCOUNTER — Ambulatory Visit (INDEPENDENT_AMBULATORY_CARE_PROVIDER_SITE_OTHER): Admitting: Family Medicine

## 2024-08-01 ENCOUNTER — Encounter: Admitting: Family Medicine
